# Patient Record
Sex: Female | Born: 1940 | Race: Black or African American | Hispanic: No | Smoking: Former smoker
Health system: Southern US, Community
[De-identification: ages and names within clinical notes are randomized; demographics above are authoritative.]

## PROBLEM LIST (undated history)

## (undated) DIAGNOSIS — E119 Type 2 diabetes mellitus without complications: Secondary | ICD-10-CM

## (undated) DIAGNOSIS — I739 Peripheral vascular disease, unspecified: Secondary | ICD-10-CM

## (undated) DIAGNOSIS — E785 Hyperlipidemia, unspecified: Secondary | ICD-10-CM

## (undated) DIAGNOSIS — R569 Unspecified convulsions: Secondary | ICD-10-CM

## (undated) DIAGNOSIS — F32A Depression, unspecified: Secondary | ICD-10-CM

## (undated) DIAGNOSIS — F329 Major depressive disorder, single episode, unspecified: Secondary | ICD-10-CM

## (undated) DIAGNOSIS — I10 Essential (primary) hypertension: Secondary | ICD-10-CM

## (undated) HISTORY — DX: Essential (primary) hypertension: I10

## (undated) HISTORY — PX: ABDOMINAL HYSTERECTOMY: SHX81

## (undated) HISTORY — DX: Unspecified convulsions: R56.9

## (undated) HISTORY — DX: Type 2 diabetes mellitus without complications: E11.9

## (undated) HISTORY — PX: EYE SURGERY: SHX253

## (undated) HISTORY — PX: SHOULDER SURGERY: SHX246

## (undated) HISTORY — DX: Depression, unspecified: F32.A

## (undated) HISTORY — DX: Major depressive disorder, single episode, unspecified: F32.9

---

## 2006-08-21 ENCOUNTER — Other Ambulatory Visit: Payer: Self-pay

## 2006-08-21 ENCOUNTER — Emergency Department: Payer: Self-pay | Admitting: Unknown Physician Specialty

## 2006-12-09 ENCOUNTER — Emergency Department: Payer: Self-pay | Admitting: Emergency Medicine

## 2007-06-29 ENCOUNTER — Emergency Department: Payer: Self-pay | Admitting: Emergency Medicine

## 2009-07-23 ENCOUNTER — Ambulatory Visit: Payer: Self-pay | Admitting: Podiatry

## 2009-08-17 ENCOUNTER — Ambulatory Visit: Payer: Self-pay | Admitting: Family Medicine

## 2009-09-29 ENCOUNTER — Ambulatory Visit: Payer: Self-pay | Admitting: Gastroenterology

## 2012-03-05 ENCOUNTER — Ambulatory Visit: Payer: Self-pay | Admitting: Orthopedic Surgery

## 2013-08-02 ENCOUNTER — Ambulatory Visit: Payer: Self-pay | Admitting: Family Medicine

## 2013-08-02 LAB — CREATININE, SERUM
Creatinine: 0.84 mg/dL (ref 0.60–1.30)
EGFR (Non-African Amer.): >60

## 2014-07-14 ENCOUNTER — Ambulatory Visit: Payer: Self-pay | Admitting: Family Medicine

## 2014-07-17 ENCOUNTER — Ambulatory Visit: Payer: Self-pay | Admitting: Family Medicine

## 2014-07-18 ENCOUNTER — Emergency Department: Payer: Self-pay | Admitting: Emergency Medicine

## 2014-07-18 LAB — CBC WITH DIFFERENTIAL/PLATELET
Basophil #: 0 10*3/uL (ref 0.0–0.1)
Basophil %: 0.5 %
Eosinophil #: 0 10*3/uL (ref 0.0–0.7)
Eosinophil %: 0.3 %
HCT: 39.3 % (ref 35.0–47.0)
HGB: 12.9 g/dL (ref 12.0–16.0)
Lymphocyte #: 1 10*3/uL (ref 1.0–3.6)
Lymphocyte %: 12.5 %
MCH: 28.8 pg (ref 26.0–34.0)
MCHC: 32.8 g/dL (ref 32.0–36.0)
MCV: 88 fL (ref 80–100)
Monocyte #: 0.3 x10 3/mm (ref 0.2–0.9)
Monocyte %: 4 %
Neutrophil #: 6.5 10*3/uL (ref 1.4–6.5)
Neutrophil %: 82.7 %
Platelet: 211 10*3/uL (ref 150–440)
RBC: 4.47 10*6/uL (ref 3.80–5.20)
RDW: 15.5 % — ABNORMAL HIGH (ref 11.5–14.5)
WBC: 7.9 10*3/uL (ref 3.6–11.0)

## 2014-07-18 LAB — URINALYSIS, COMPLETE
BACTERIA: NONE SEEN
Bilirubin,UR: NEGATIVE
Blood: NEGATIVE
Glucose,UR: >=500 mg/dL (ref 0–75)
Hyaline Cast: 2
Ketone: NEGATIVE
NITRITE: NEGATIVE
PH: 5 (ref 4.5–8.0)
PROTEIN: NEGATIVE
RBC,UR: 1 /HPF (ref 0–5)
Specific Gravity: 1.024 (ref 1.003–1.030)
WBC UR: 1 /HPF (ref 0–5)

## 2014-07-18 LAB — COMPREHENSIVE METABOLIC PANEL
Albumin: 3.6 g/dL (ref 3.4–5.0)
Alkaline Phosphatase: 118 U/L — ABNORMAL HIGH
Anion Gap: 7 (ref 7–16)
BUN: 20 mg/dL — ABNORMAL HIGH (ref 7–18)
Bilirubin,Total: 0.2 mg/dL (ref 0.2–1.0)
Calcium, Total: 8.9 mg/dL (ref 8.5–10.1)
Chloride: 101 mmol/L (ref 98–107)
Co2: 23 mmol/L (ref 21–32)
Creatinine: 1.08 mg/dL (ref 0.60–1.30)
EGFR (African American): 59 — ABNORMAL LOW
EGFR (Non-African Amer.): 51 — ABNORMAL LOW
Glucose: 262 mg/dL — ABNORMAL HIGH (ref 65–99)
Osmolality: 274 (ref 275–301)
Potassium: 4.2 mmol/L (ref 3.5–5.1)
SGOT(AST): 33 U/L (ref 15–37)
SGPT (ALT): 46 U/L
Sodium: 131 mmol/L — ABNORMAL LOW (ref 136–145)
Total Protein: 7.8 g/dL (ref 6.4–8.2)

## 2014-07-18 LAB — TROPONIN I: Troponin-I: <0.02 ng/mL

## 2014-07-18 LAB — LIPASE, BLOOD: Lipase: 159 U/L (ref 73–393)

## 2014-07-20 LAB — PATHOLOGY REPORT

## 2014-11-16 ENCOUNTER — Ambulatory Visit: Payer: Self-pay | Admitting: Ophthalmology

## 2015-03-03 ENCOUNTER — Other Ambulatory Visit: Payer: Self-pay | Admitting: *Deleted

## 2015-03-03 ENCOUNTER — Encounter: Payer: Self-pay | Admitting: *Deleted

## 2015-03-03 VITALS — BP 145/72 | HR 91 | Resp 20 | Ht 64.0 in | Wt 207.0 lb

## 2015-03-03 DIAGNOSIS — IMO0001 Reserved for inherently not codable concepts without codable children: Secondary | ICD-10-CM | POA: Insufficient documentation

## 2015-03-03 DIAGNOSIS — R030 Elevated blood-pressure reading, without diagnosis of hypertension: Principal | ICD-10-CM

## 2015-03-03 NOTE — Patient Outreach (Signed)
   03/03/2015   Megan Gates Mar 08, 1941 [id number]    Subjective:  Pt reports her son from [place] came down, took her out.  Pt states she is thinking of going to see him.  Pt states she is to see Dr. Thana AtesMorrisey in June for a physical.  Pt states she got a BP machine in the mail, does not know how to use it.  Pt states blood Sugar yesterday was 120, 2 days earlier 133.     Plan:   With RN CM assisting pt on use of her new BP machine, pt to check BP/record- three times for the next 30 days.              RN CM to continue to provide ongoing community nurse case management services.                Next scheduled home visit 4/21.

## 2015-03-04 NOTE — Patient Instructions (Signed)
With pt receiving BP machine in the mail, demonstrated use to pt, voiced understanding.   Pt instructed to check BP three times a week, [facility]- also informed son. Pt to continue to monitor sodium intake.

## 2015-03-04 NOTE — Patient Outreach (Signed)
   03/04/2015   Lovey NewcomerBetty Lou Jamison 1941-08-30 [id number]  Subjective: Pt reports son from [place] came down for 3 days, took her out.  Pt states I am thinking of to going to see son.  Pt states blood sugar yesterday was 120, 133 on Sunday.  Pt states she is to f/u with Dr. Thana AtesMorrisey in June for physical.  Pt states she got this BP machine in the mail, reports does not know how to use.      Objective:  Health assessment done.  Niece still filling pt's pill planner weekly, son makes sure pt takes meds.   Assessment:  Review of pt's recorded blood sugars in [facility]- 105,120,120.                           Glucometer does not provide averages, only last reading.    Plan:  Next home visit- review care plan,work toward goals            Pt to start checking BP three days a week, [facility]            RN CM to [facility] for BP readings            Next home visit scheduled for 4/21.

## 2015-04-01 ENCOUNTER — Other Ambulatory Visit: Payer: Self-pay | Admitting: *Deleted

## 2015-04-01 VITALS — BP 160/80 | HR 79 | Resp 20

## 2015-04-01 DIAGNOSIS — I10 Essential (primary) hypertension: Secondary | ICD-10-CM

## 2015-04-02 ENCOUNTER — Other Ambulatory Visit: Payer: Self-pay | Admitting: *Deleted

## 2015-04-02 NOTE — Patient Outreach (Signed)
Attempt made to contact pt's son Mindi JunkerKenneth Summers (on [facility] consent form).   HIPPA compliant voice message left.     Shayne Alkenose M.   Pierzchala RN CCM [facility] Care [facility]  [phone]

## 2015-04-02 NOTE — Patient Outreach (Signed)
Received return phone call from son Mindi JunkerKenneth Summers (lives in [place], on [facility]).  RN CM discussed with son was informed by pt yesterday during home visit niece is not filling her pill planner.   For  Better med compliance for pt, discussed with son the  benefit of  Switching pt's medications to  [facility] where bubble packs provided.   Son states he is trying to get it done, to have niece do it.  Son states he will call tomorrow.   RN CM discussed with son was planning  [facility] but plan to do final home visit next month to review medication compliance.   Next home visit scheduled with pt- 5/20.   Shayne Alkenose M.   Pierzchala RN CCM [facility] Care Management  [phone]

## 2015-04-02 NOTE — Patient Outreach (Signed)
[facility]) Care Management  [facility] Care Manager  04/02/2015   Khiley Lieser [birth year] [id number]  Subjective: Pt states she fell recently going up back steps, no injury.  Pt states son who stays With her is not about to check her BP,needs niece who is a retired Engineer, civil (consulting).  Pt states her niece has Not been filling her pill planner, just taking medications  from the bottles, not missing any.  Pt states she Has 2 medications called in, need picking up.        Objective: ROS  Lungs clear.  No edema.  Filed Vitals:   04/01/15 1108  BP: 160/80  Pulse: 79  Resp: 20     Current Medications:  Current Outpatient Prescriptions  Medication Sig Dispense Refill  . atorvastatin (LIPITOR) 40 MG tablet Take 40 mg by mouth at bedtime.    . donepezil (ARICEPT) 10 MG tablet Take 10 mg by mouth every morning.    . fenofibrate (TRICOR) 145 MG tablet Take 145 mg by mouth daily. Pt states has no refills, pharmacy to call MD about refills.    Marland Kitchen ibuprofen (ADVIL,MOTRIN) 200 MG tablet Take 200 mg by mouth every 6 (six) hours as needed. Pt takes one every morning    . imipramine (TOFRANIL) 25 MG tablet Take 25 mg by mouth at bedtime. Pt takes three  25 mg tablets at bedtime    . meloxicam (MOBIC) 15 MG tablet Take 15 mg by mouth daily.    . memantine (NAMENDA XR) 28 MG CP24 24 hr capsule Take 28 mg by mouth daily.    Marland Kitchen omeprazole (PRILOSEC) 20 MG capsule Take 20 mg by mouth daily.    . Armodafinil 150 MG tablet Take 150 mg by mouth daily.     No current facility-administered medications for this visit.    Falls- pt reports recent fall, no injury   Assessment:  Elevated BP- son  not been checking pt's BP                          Medication compliance-  Review of meds- pt reports been out of Aricept for couple weeks, no                                                                     More refills on Fenofibrate.   Plan: RN CM spoke with Ward Chatters (pt's niece, on [facility] consent  form), niece states checks on             On pt daily, does not fill her pill planner,need to transfer pt's medications to [facility] (bubble             Packs), need to call son in [place].            RN CM called pt's pharmacy,requested refill on pt's Aricept and MD be called about Fenofibrate               (no more refills).            Plan to f/u with pt's son Mindi Junker (lives in [place]), discuss switching pt's medications to  [facility] (provide bubble packs) - improve compliance           Plan not to discharge pt today from RN CM services but f/u again next month.    Shayne Alkenose M.   Makailah Slavick RN CCM [facility] Care Management  [phone]

## 2015-04-09 ENCOUNTER — Other Ambulatory Visit: Payer: Self-pay | Admitting: *Deleted

## 2015-04-09 NOTE — Patient Outreach (Signed)
Follow up call:  Spoke with pt to see if son Iantha Fallen(Kenneth) from [place] f/u on switching medications to [facility] (provide bubble packs),also if son talked to niece (local) about this matter.   Pt states she has not talked to son, he has been trying.  Pt states she will be seeing niece this weekend and will talk to her.   RN CM discussed with pt the option of [facility].  Discussed with pt community resources available also Radio producer([employer], [facility]).  Plan to f/u again with pt about her medications, call 5/3.

## 2015-04-21 ENCOUNTER — Other Ambulatory Visit: Payer: Self-pay | Admitting: *Deleted

## 2015-04-21 NOTE — Patient Outreach (Signed)
Follow up phone call:  RN CM f/u with pt on her medications, if son or niece switched her to [facility] (provide bubble packs) to which pt states talked to niece, slow about doing things.   Pt states she has 2 bottles she called in for,did not pick up yet.  Pt states she took her last Meloxicam last night, did not call that one in yet.  Pt states she has been out of Imipramine 2 days, Fenofibrate out of for awhile, no refills on it.   Pt states she used to get her medications through the mail.  Pt states she is hoping to get her money tomorrow, pick up meds.   RN CM asked pt if can call her pharmacy and inquire about medications ready for pick up as well as status of Fenofibrate (this RN CM called pt's pharmacy last month- was to call MD about refill).   RN CM to call pt back.       Shayne Alkenose M.   Primus Gritton RN CCM [facility] Care Management  [phone]

## 2015-04-21 NOTE — Patient Outreach (Signed)
Follow up phone call:  RN CM informed pt [facility], was told Imipramine is ready for pick up, Meloxicam is too early to fill- filled 4/26, pt should still have some and have not heard back from MD on getting Fenofibrate refilled, will send him another fax.   Pt did confirm still has some Meloxicam.   RN CM discussed with pt doing an acute home visit tomorrow 5/12, talk [facility], assist pt as needed to which pt agreed.     Megan Alkenose M.   Pierzchala RN CCM [facility] Care Management  [phone]

## 2015-04-22 ENCOUNTER — Encounter: Payer: Self-pay | Admitting: *Deleted

## 2015-04-22 ENCOUNTER — Other Ambulatory Visit: Payer: Self-pay | Admitting: *Deleted

## 2015-04-22 NOTE — Patient Outreach (Signed)
[facility]) Care Management  [place]  04/22/2015   Megan Gates 02-20-1941 [id number]  Subjective:  Pt states did not sleep good last night, tries to exercise during the day- can't stand  Long, get tired.  Pt  states she has not picked up her one medicine (Imipramine) from the pharmacy yet, had enough  To take last night, hoping to get her money today.  Pt states she used to get her meds through the mail (indicated  Worked good).  Pt states she is to see Dr. Rutherford Nail 6/8.  Pt states blood sugar this am was 169.  Pt states  She has been watching her salt, had one [place] sausage/2 eggs/jelly this am.     Objective:  BP 170/88 left arm, 150/80 right arm. O2 sat -95, respirations-20, pulse 88.                      Blood sugars per view of pt's glucometer, ranges 120 to 213 (after eating), today 169.                Current Medications:  Current Outpatient Prescriptions  Medication Sig Dispense Refill  . atorvastatin (LIPITOR) 40 MG tablet Take 40 mg by mouth at bedtime.    . donepezil (ARICEPT) 10 MG tablet Take 10 mg by mouth every morning.    Marland Kitchen ibuprofen (ADVIL,MOTRIN) 200 MG tablet Take 200 mg by mouth every 6 (six) hours as needed. Pt takes one every morning    . imipramine (TOFRANIL) 25 MG tablet Take 25 mg by mouth at bedtime. Pt takes three  25 mg tablets at bedtime    . meloxicam (MOBIC) 15 MG tablet Take 15 mg by mouth daily.    . memantine (NAMENDA XR) 28 MG CP24 24 hr capsule Take 28 mg by mouth daily.    Marland Kitchen omeprazole (PRILOSEC) 20 MG capsule Take 20 mg by mouth daily.    . Armodafinil 150 MG tablet Take 150 mg by mouth daily.    . fenofibrate (TRICOR) 145 MG tablet Take 145 mg by mouth daily. Pt states has no refills, pharmacy to call MD about refills.     No current facility-administered medications for this visit.  note- pt states not taking Armodafinil   Assessment:  Medications- pt had all of her meds except Tricor, still waiting for MD to  call pharmacy to have  Refilled.   RN CM discussed with pt option of [facility] mail order pharmacy for her meds to which said agreed.   Elevated BP- 170/88 left arm, 150/80 right arm, closer monitoring of sodium intake needed.    Plan:  RN CM called Dr. Walker Kehr office, spoke with Larey Seat to relay message to nurse pt wants  To receive her medications through [facility] mail order pharmacy, MD needs to fax prescriptions, fax  Number provided.            Pt to monitor sodium intake, BP elevated today.            Pt to f/u with Dr. Rutherford Nail 6/8            RN CM to f/u with pt telephonically 5/19 to check on status of mail order pharmacy, if started                    Plan to resolve care plan/close pt's case.  [facility] CM Care Plan Problem One        Patient Outreach from 04/22/2015 in [place] Problem One   BP not within norm    Care Plan for Problem One  Active   [facility] CM Short Term Goal #1 (0-30 days)  pt to switch to low Na+ luncheon meat in the next 30 days    [facility] CM Short Term Goal #1 Start Date  02/04/15   [facility] CM Short Term Goal #1 Met Date  03/03/15   Interventions for Short Term Goal #1  Discussed with son switching luncheon meat to Low Na+ ([place]), showed current meat 661m per serving   [facility] CM Short Term Goal #2 (0-30 days)  Pt to check BP/record three times a week for the next 30 days    [facility] CM Short Term Goal #2 Start Date  03/03/15   [facility] Goal #2 Met Date  -- [not met ]   Interventions for Short Term Goal #2  Discussed with pt and son importance of monitoring BP/recording.     [facility] Care [facility] Problem Two        [place]  Medications issues- difficulty at times getting meds on time    Care Plan for Problem Two  Active   [facility] CM Short Term Goal #1 (0-30 days)  Pt would have a  better system of getting her meds within the next 10 days    [facility] CM  Short Term Goal #1 Start Date  04/22/15   Interventions for Short Term Goal #2   RN CM called pt's MD office, left message pt requesting [facility], MD needs to fax prescriptiions      RZara Chess   PWau[facility]aCare Management  [phone]

## 2015-04-29 ENCOUNTER — Other Ambulatory Visit: Payer: Self-pay | Admitting: *Deleted

## 2015-04-29 ENCOUNTER — Encounter: Payer: Self-pay | Admitting: *Deleted

## 2015-04-29 ENCOUNTER — Ambulatory Visit: Payer: Medicare PPO | Admitting: *Deleted

## 2015-04-29 NOTE — Patient Outreach (Signed)
F/u phone call to Dr. Clance BollMorrisey's office: Megan MondaySpoke with MD's nurse Megan JacobsonHelen, f/u on message RN CM  left 5/12 with pt's  request for MD to fax [facility].   MD's nurse  reviewed with RN CM pt's med list which included Armodafinil to which informed nurse  pt does not have, has not been taking.  MD's nurse confirmed  pt is to be on Armodafinil, as requested to [facility] pt's  [facility].      Plan to call pt, let her know MD's nurse to contact [employer], have meds filled through mail order   Megan Gates M.   Megan Brouillet RN CCM [facility] Care Management  [phone]

## 2015-04-29 NOTE — Patient Outreach (Signed)
Called pt back, informed her spoke with MD's nurse Megan JacobsonHelen (f/u on request pt wants to get all of her [facility]), MD's [facility] to [facility].   Discussed with pt plan to call her again 5/23 to see if [facility] her.  Plan to discharge if issue (medications switched to mail order) resolved.       Shayne Alkenose M.   Pierzchala RN CCM [facility] Care Management  [phone]

## 2015-04-29 NOTE — Patient Outreach (Signed)
Follow up phone call:  RN CM inquired of pt if [facility] her medications to which she said no.  Pt states she had to call her pharmacy today on 3 of her meds, had one pill left with each, to try and get medications today.   RN CM discussed with pt plan to  f/u with Dr. Clance BollMorrisey's office, inquire if her medications were [facility] and get back to her.       Shayne Alkenose M.   Pierzchala RN CCM [facility] Care Management  [phone]

## 2015-04-29 NOTE — Patient Outreach (Signed)
[facility]) Care Management  04/29/2015  Lovey NewcomerBetty Lou Komatsu 03/12/1941 [id number]   Documentation encounter:  Attempt made to contact Dr. Clance BollMorrisey's office, inquire for pt if medications were [facility].   Message received said  office closed, will try again later.       Shayne Alkenose M.   Pierzchala RN CCM [facility] Care Management  [phone]

## 2015-04-30 ENCOUNTER — Ambulatory Visit: Payer: Medicare PPO | Admitting: *Deleted

## 2015-05-03 ENCOUNTER — Other Ambulatory Visit: Payer: Self-pay | Admitting: *Deleted

## 2015-05-03 NOTE — Patient Outreach (Signed)
Follow up phone call:  Pt states she [facility]  Pt states she picked up three of her meds from pharmacy (local), has all of her medications.   RN CM discussed f/u again 5/27 to see if [facility] her to which pt agreed.

## 2015-05-07 ENCOUNTER — Other Ambulatory Visit: Payer: Self-pay | Admitting: *Deleted

## 2015-05-07 ENCOUNTER — Encounter: Payer: Self-pay | Admitting: *Deleted

## 2015-05-07 NOTE — Patient Outreach (Signed)
Follow up phone call:  RN CM inquired of pt if heard from [employer] about getting her medication through mail order.  Pt states she did get medications in the mail, have enough to last.  Pt states she has a paper to send in when her medications are low.   Pt reports this is a better system (getting her meds through mail order).  Pt reports  she is compliant with taking all of her medications.  Discussed with pt plan to discharge her from RN CM services, goals met.   Plan to inform  [facility] CM assistant and MD of case closure.      M.    RN CCM [facility] Care Management  [phone]  

## 2015-05-13 NOTE — Patient Outreach (Signed)
[facility]) Care Management  05/13/2015  Megan Gates 01-Oct-1941 [id number]   Notification from [name], RN to close case as [facility] Care Management goals are met.  [name]. [place], [place] Management [place] Assistant Phone: [phone] Fax: [phone]

## 2015-05-19 ENCOUNTER — Encounter: Payer: Self-pay | Admitting: Family Medicine

## 2015-05-19 ENCOUNTER — Ambulatory Visit (INDEPENDENT_AMBULATORY_CARE_PROVIDER_SITE_OTHER): Payer: Medicare PPO | Admitting: Family Medicine

## 2015-05-19 VITALS — BP 128/76 | HR 84 | Temp 98.7°F | Resp 16 | Ht 64.0 in | Wt 205.3 lb

## 2015-05-19 DIAGNOSIS — G47419 Narcolepsy without cataplexy: Secondary | ICD-10-CM

## 2015-05-19 DIAGNOSIS — F0390 Unspecified dementia without behavioral disturbance: Secondary | ICD-10-CM

## 2015-05-19 DIAGNOSIS — I10 Essential (primary) hypertension: Secondary | ICD-10-CM | POA: Diagnosis not present

## 2015-05-19 DIAGNOSIS — E669 Obesity, unspecified: Secondary | ICD-10-CM

## 2015-05-19 DIAGNOSIS — E785 Hyperlipidemia, unspecified: Secondary | ICD-10-CM | POA: Diagnosis not present

## 2015-05-19 DIAGNOSIS — E119 Type 2 diabetes mellitus without complications: Secondary | ICD-10-CM

## 2015-05-19 LAB — GLUCOSE, POCT (MANUAL RESULT ENTRY): POC Glucose: 174 mg/dl — AB (ref 70–99)

## 2015-05-19 LAB — POCT GLYCOSYLATED HEMOGLOBIN (HGB A1C): HEMOGLOBIN A1C: 7.2

## 2015-05-19 NOTE — Progress Notes (Signed)
Name: Megan Gates   MRN: [id number]    DOB: Aug 02, 1941   Date:05/19/2015       Progress Note  Subjective  Chief Complaint  Chief Complaint  Patient presents with  . Depression  . Hypertension  . Diabetes  . Dementia    Hypertension This is a chronic problem. The current episode started more than 1 year ago. The problem has been gradually improving since onset. The problem is controlled. Associated symptoms include anxiety and headaches. Pertinent negatives include no blurred vision, chest pain, neck pain, orthopnea, palpitations or shortness of breath. There are no associated agents to hypertension. Risk factors for coronary artery disease include dyslipidemia, diabetes mellitus, sedentary lifestyle and smoking/tobacco exposure. The current treatment provides moderate improvement. Compliance problems include diet.  There is no history of CVA.  Diabetes She presents for her follow-up diabetic visit. She has type 2 diabetes mellitus. Hypoglycemia symptoms include headaches. Pertinent negatives for hypoglycemia include no dizziness, nervousness/anxiousness, seizures or tremors. Pertinent negatives for diabetes include no blurred vision, no chest pain, no weakness and no weight loss. Pertinent negatives for diabetic complications include no autonomic neuropathy or CVA. Risk factors for coronary artery disease include diabetes mellitus, dyslipidemia and hypertension. Her weight is increasing steadily. She is following a diabetic diet. She has had a previous visit with a dietitian. She rarely participates in exercise.   NARCOLEPSY Fewer spells have been noted now that she is on her medication on a regular basis.  DEMENTIA Granddaughter states that her memory has improved. Patient is well remarks that she seems to be less forgetful than in the past and there are no hallucinations currently.    DEPRESSION Patient's depression is overall improved. She has fewer crying episodes. She is more  socially involved. She remains her medications on a regular basis.   Past Medical History  Diagnosis Date  . Hypertension   . Diabetes mellitus without complication   . Seizures   . Depression     History  Substance Use Topics  . Smoking status: Former Smoker -- 1.00 packs/day  . Smokeless tobacco: Former Neurosurgeon  . Alcohol Use: No     Current outpatient prescriptions:  .  Armodafinil 150 MG tablet, Take 150 mg by mouth daily., Disp: , Rfl:  .  atorvastatin (LIPITOR) 40 MG tablet, Take 40 mg by mouth at bedtime., Disp: , Rfl:  .  donepezil (ARICEPT) 10 MG tablet, Take 10 mg by mouth every morning., Disp: , Rfl:  .  fenofibrate (TRICOR) 145 MG tablet, Take 145 mg by mouth daily. Pt states has no refills, pharmacy to call MD about refills., Disp: , Rfl:  .  ibuprofen (ADVIL,MOTRIN) 200 MG tablet, Take 200 mg by mouth every 6 (six) hours as needed. Pt takes one every morning, Disp: , Rfl:  .  imipramine (TOFRANIL) 25 MG tablet, Take 25 mg by mouth at bedtime. Pt takes three  25 mg tablets at bedtime, Disp: , Rfl:  .  meloxicam (MOBIC) 15 MG tablet, Take 15 mg by mouth daily., Disp: , Rfl:  .  memantine (NAMENDA XR) 28 MG CP24 24 hr capsule, Take 28 mg by mouth daily., Disp: , Rfl:  .  omeprazole (PRILOSEC) 20 MG capsule, Take 20 mg by mouth daily., Disp: , Rfl:   No Known Allergies  Review of Systems  Constitutional: Negative for fever, chills and weight loss.  HENT: Negative for congestion, hearing loss, sore throat and tinnitus.   Eyes: Negative for blurred vision, double vision  and redness.  Respiratory: Negative for cough, hemoptysis and shortness of breath.   Cardiovascular: Negative for chest pain, palpitations, orthopnea, claudication and leg swelling.  Gastrointestinal: Negative for heartburn, nausea, vomiting, diarrhea, constipation and blood in stool.  Genitourinary: Negative for dysuria, urgency, frequency and hematuria.  Musculoskeletal: Negative for myalgias, back pain,  joint pain, falls and neck pain.  Skin: Negative for itching.  Neurological: Positive for headaches. Negative for dizziness, tingling, tremors, focal weakness, seizures, loss of consciousness and weakness.  Endo/Heme/Allergies: Does not bruise/bleed easily.  Psychiatric/Behavioral: Negative for depression and substance abuse. The patient is not nervous/anxious and does not have insomnia.        Narcoleptic episodes less frequent     Objective  Filed Vitals:   05/19/15 1020  BP: 128/76  Pulse: 84  Temp: 98.7 F (37.1 C)  TempSrc: Oral  Resp: 16  Height: 5\' 4"  (1.626 m)  Weight: 205 lb 4.8 oz (93.123 kg)  SpO2: 97%     Physical Exam  Constitutional: She is oriented to person, place, and time and well-developed, well-nourished, and in no distress.  HENT:  Head: Normocephalic.  Eyes: EOM are normal. Pupils are equal, round, and reactive to light.  Neck: Normal range of motion. No thyromegaly present.  Cardiovascular: Normal rate, regular rhythm and normal heart sounds.   No murmur heard. Pulmonary/Chest: Effort normal and breath sounds normal.  Abdominal: Soft. Bowel sounds are normal.  Musculoskeletal: Normal range of motion. She exhibits no edema.  Neurological: She is alert and oriented to person, place, and time. No cranial nerve deficit. Gait normal.  Skin: Skin is warm and dry. No rash noted.  Psychiatric: Memory and affect normal.      Assessment & Plan  1. Type 2 diabetes mellitus without complication Well-controlled by diet alone - POCT Glucose (CBG) - POCT HgB A1C - Comprehensive metabolic panel; Future - Comprehensive metabolic panel  2. Essential hypertension Currently under good control  3. 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 Obesity A perennial problem and patient is not  motivated  6. Dementia, without behavioral  disturbance Improving overall blood palpation and granddaughter's report - TSH

## 2015-05-19 NOTE — Patient Instructions (Signed)
F/u

## 2015-05-20 LAB — TSH: TSH: 0.936 u[IU]/mL (ref 0.450–4.500)

## 2015-05-20 LAB — COMPREHENSIVE METABOLIC PANEL
ALBUMIN: 4.3 g/dL (ref 3.5–4.8)
ALK PHOS: 133 IU/L — AB (ref 39–117)
ALT: 11 IU/L (ref 0–32)
AST: 16 IU/L (ref 0–40)
Albumin/Globulin Ratio: 1.5 (ref 1.1–2.5)
BUN/Creatinine Ratio: 22 (ref 11–26)
BUN: 17 mg/dL (ref 8–27)
Bilirubin Total: <0.2 mg/dL (ref 0.0–1.2)
CHLORIDE: 101 mmol/L (ref 97–108)
CO2: 22 mmol/L (ref 18–29)
CREATININE: 0.76 mg/dL (ref 0.57–1.00)
Calcium: 9.4 mg/dL (ref 8.7–10.3)
GFR calc Af Amer: 89 mL/min/{1.73_m2} (ref >59)
GFR calc non Af Amer: 77 mL/min/{1.73_m2} (ref >59)
GLUCOSE: 196 mg/dL — AB (ref 65–99)
Globulin, Total: 2.8 g/dL (ref 1.5–4.5)
Potassium: 4.7 mmol/L (ref 3.5–5.2)
SODIUM: 140 mmol/L (ref 134–144)
TOTAL PROTEIN: 7.1 g/dL (ref 6.0–8.5)

## 2015-05-20 LAB — LIPID PANEL
CHOL/HDL RATIO: 2.9 ratio (ref 0.0–4.4)
CHOLESTEROL TOTAL: 126 mg/dL (ref 100–199)
HDL: 43 mg/dL (ref >39)
LDL Calculated: 34 mg/dL (ref 0–99)
Triglycerides: 246 mg/dL — ABNORMAL HIGH (ref 0–149)
VLDL Cholesterol Cal: 49 mg/dL — ABNORMAL HIGH (ref 5–40)

## 2015-05-20 NOTE — Progress Notes (Signed)
Patient notified

## 2015-09-09 ENCOUNTER — Ambulatory Visit: Payer: Medicare PPO | Admitting: Family Medicine

## 2015-09-21 ENCOUNTER — Encounter: Payer: Self-pay | Admitting: Family Medicine

## 2015-09-21 ENCOUNTER — Ambulatory Visit (INDEPENDENT_AMBULATORY_CARE_PROVIDER_SITE_OTHER): Payer: Medicare PPO | Admitting: Family Medicine

## 2015-09-21 VITALS — BP 138/60 | HR 95 | Temp 98.5°F | Resp 16 | Ht 64.0 in | Wt 200.5 lb

## 2015-09-21 DIAGNOSIS — I10 Essential (primary) hypertension: Secondary | ICD-10-CM | POA: Diagnosis not present

## 2015-09-21 DIAGNOSIS — G47419 Narcolepsy without cataplexy: Secondary | ICD-10-CM | POA: Diagnosis not present

## 2015-09-21 DIAGNOSIS — F0390 Unspecified dementia without behavioral disturbance: Secondary | ICD-10-CM | POA: Diagnosis not present

## 2015-09-21 DIAGNOSIS — N183 Chronic kidney disease, stage 3 unspecified: Secondary | ICD-10-CM

## 2015-09-21 DIAGNOSIS — E785 Hyperlipidemia, unspecified: Secondary | ICD-10-CM

## 2015-09-21 DIAGNOSIS — R413 Other amnesia: Secondary | ICD-10-CM | POA: Diagnosis not present

## 2015-09-21 DIAGNOSIS — E1169 Type 2 diabetes mellitus with other specified complication: Secondary | ICD-10-CM | POA: Diagnosis not present

## 2015-09-21 LAB — POCT GLYCOSYLATED HEMOGLOBIN (HGB A1C): Hemoglobin A1C: 6.5

## 2015-09-21 LAB — GLUCOSE, POCT (MANUAL RESULT ENTRY): POC Glucose: 91 mg/dl (ref 70–99)

## 2015-09-21 NOTE — Progress Notes (Signed)
Name: Megan Gates   MRN: [id number]    DOB: Apr 01, 1941   Date:09/21/2015       Progress Note  Subjective  Chief Complaint  Chief Complaint  Patient presents with  . Hypertension  . Diabetes    4 month follow up  . Hyperlipidemia  . Memory Loss    HPI Diabetes  Patient presents for follow-up of diabetes which is present for over 5 years. Is currently on a regimen of diet and exercise alone . Patient states poor compliance with their diet and exercise. There's been no hypoglycemic episodes and there is no polyuria polydipsia polyphagia. His average fasting glucoses been in the low around - with a high around - . There is no end organ disease.  Last diabetic eye exam was apparently over one year ago.   Last visit with dietitian was over one year ago. Last microalbumin was obtained 20 on February 2016 .   Hypertension   Patient presents for follow-up of hypertension. It has been present for over 5 years.  Patient states that there is compliance with medical regimen which consists of exercise only . There is no end organ disease. Cardiac risk factors include hypertension hyperlipidemia and diabetes and tobacco abuse.  Exercise regimen consist of minimal .  Diet consist of an appropriate choices .  Hyperlipidemia  Patient has a history of hyperlipidemia for over 5 years.  Current medical regimen consist of atorvastatin 40 mg daily at bedtime .  Compliance is questionable .  Diet and exercise are currently followed not very well .  Risk factors for cardiovascular disease include hyperlipidemia see above .   There have been no side effects from the medication.    Dementia history of present illness  Daughter feels that the dementia has stabilized. Mom memory still has occasionally rub.Marland Kitchen Anxiety symptomatology has resolved. 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 No insomnia or other side effects  from the Provigil      Past Medical History  Diagnosis Date  . Hypertension   . Diabetes mellitus without complication (HCC)   . Seizures (HCC)   . Depression     Social History  Substance Use Topics  . Smoking status: Former Smoker -- 1.00 packs/day  . Smokeless tobacco: Former Neurosurgeon  . Alcohol Use: No     Current outpatient prescriptions:  .  Armodafinil 150 MG tablet, Take 150 mg by mouth daily., Disp: , Rfl:  .  atorvastatin (LIPITOR) 40 MG tablet, Take 40 mg by mouth at bedtime., Disp: , Rfl:  .  citalopram (CELEXA) 20 MG tablet, , Disp: , Rfl:  .  donepezil (ARICEPT) 10 MG tablet, Take 10 mg by mouth every morning., Disp: , Rfl:  .  fenofibrate (TRICOR) 145 MG tablet, Take 145 mg by mouth daily. Pt states has no refills, pharmacy to call MD about refills., Disp: , Rfl:  .  ibuprofen (ADVIL,MOTRIN) 200 MG tablet, Take 200 mg by mouth every 6 (six) hours as needed. Pt takes one every morning, Disp: , Rfl:  .  imipramine (TOFRANIL) 25 MG tablet, Take 25 mg by mouth at bedtime. Pt takes three  25 mg tablets at bedtime, Disp: , Rfl:  .  meloxicam (MOBIC) 15 MG tablet, Take 15 mg by mouth daily., Disp: , Rfl:  .  memantine (NAMENDA XR) 28 MG CP24 24 hr capsule, Take 28 mg by mouth daily., Disp: ,  Rfl:  .  omeprazole (PRILOSEC) 20 MG capsule, Take 20 mg by mouth daily., Disp: , Rfl:   No Known Allergies  Review of Systems  Constitutional: Negative for fever, chills and weight loss.  HENT: Negative for congestion, hearing loss, sore throat and tinnitus.   Eyes: Negative for blurred vision, double vision and redness.  Respiratory: Positive for cough and wheezing. Negative for hemoptysis and shortness of breath.   Cardiovascular: Negative for chest pain, palpitations, orthopnea, claudication and leg swelling.  Gastrointestinal: Negative for heartburn, nausea, vomiting, diarrhea, constipation and blood in stool.  Genitourinary: Negative for dysuria, urgency, frequency and  hematuria.  Musculoskeletal: Positive for joint pain. Negative for myalgias, back pain, falls and neck pain.  Skin: Negative for itching.  Neurological: Negative for dizziness, tingling, tremors, focal weakness, seizures, loss of consciousness, weakness and headaches.       Narcolepsy  Endo/Heme/Allergies: Does not bruise/bleed easily.  Psychiatric/Behavioral: Positive for depression and memory loss. Negative for substance abuse. The patient is not nervous/anxious and does not have insomnia.      Objective  Filed Vitals:   09/21/15 1028  BP: 138/60  Pulse: 95  Temp: 98.5 F (36.9 C)  TempSrc: Oral  Resp: 16  Height:  (1.626 m)  Weight: 200 lb 8 oz (90.946 kg)  SpO2: 96%     Physical Exam  Constitutional:  Obese African-American in no acute distress  HENT:  Head: Normocephalic.  Eyes: EOM are normal. Pupils are equal, round, and reactive to light.  Neck: Normal range of motion. No thyromegaly present.  Cardiovascular: Normal rate, regular rhythm and normal heart sounds.   No murmur heard. Pulmonary/Chest: Effort normal and breath sounds normal.  Abdominal: Soft. Bowel sounds are normal.  Musculoskeletal: She exhibits edema.  Neurological: She is alert. No cranial nerve deficit.  Patient with dementia and forgetfulness but improved since last visit  Skin: Skin is warm and dry. No rash noted.         Assessment & Plan   1. Type 2 diabetes mellitus with other specified complication (HCC) Now back in normal range - POCT HgB A1C - POCT Glucose (CBG) - Ambulatory Referral for Home Visit  2. Essential hypertension Near goal - POCT HgB A1C  3. Hyperlipidemia  4. Memory change Slightly improved and I'll outline - Ambulatory Referral for Home Visit  5. Narcolepsy Stable  6. Dementia, without behavioral disturbance Stable fine work Some

## 2015-09-24 ENCOUNTER — Ambulatory Visit (INDEPENDENT_AMBULATORY_CARE_PROVIDER_SITE_OTHER): Payer: Medicare PPO

## 2015-09-24 DIAGNOSIS — Z23 Encounter for immunization: Secondary | ICD-10-CM | POA: Diagnosis not present

## 2015-09-27 ENCOUNTER — Telehealth: Payer: Self-pay | Admitting: Surgery

## 2015-09-27 NOTE — Telephone Encounter (Signed)
Dr Ninetta LightsHatcher said we don't treat pts w/ this diagnosis-Told Shelley in PCP's office (Type 2 Diabetes mellitus

## 2015-10-01 ENCOUNTER — Other Ambulatory Visit: Payer: Self-pay | Admitting: Family Medicine

## 2015-11-01 ENCOUNTER — Encounter: Payer: Self-pay | Admitting: Family Medicine

## 2015-11-01 ENCOUNTER — Ambulatory Visit
Admission: RE | Admit: 2015-11-01 | Discharge: 2015-11-01 | Disposition: A | Discharge status: Home / Self Care | Payer: Medicare PPO | Source: Ambulatory Visit | Attending: Family Medicine | Admitting: Family Medicine

## 2015-11-01 ENCOUNTER — Ambulatory Visit (INDEPENDENT_AMBULATORY_CARE_PROVIDER_SITE_OTHER): Payer: Medicare PPO | Admitting: Family Medicine

## 2015-11-01 VITALS — BP 128/70 | HR 82 | Temp 98.7°F | Resp 16 | Ht 64.0 in | Wt 193.0 lb

## 2015-11-01 DIAGNOSIS — I96 Gangrene, not elsewhere classified: Secondary | ICD-10-CM

## 2015-11-01 DIAGNOSIS — M19041 Primary osteoarthritis, right hand: Secondary | ICD-10-CM | POA: Insufficient documentation

## 2015-11-01 DIAGNOSIS — M79644 Pain in right finger(s): Secondary | ICD-10-CM | POA: Diagnosis not present

## 2015-11-01 DIAGNOSIS — L03818 Cellulitis of other sites: Secondary | ICD-10-CM | POA: Diagnosis not present

## 2015-11-01 DIAGNOSIS — L98499 Non-pressure chronic ulcer of skin of other sites with unspecified severity: Secondary | ICD-10-CM | POA: Diagnosis not present

## 2015-11-01 MED ORDER — CLINDAMYCIN HCL 300 MG PO CAPS: 300.0000 mg | ORAL_CAPSULE | Freq: Three times a day (TID) | ORAL | Status: DC

## 2015-11-01 NOTE — Progress Notes (Signed)
Name: Megan Gates   MRN: [id number]    DOB: 1941-05-14   Date:11/01/2015       Progress Note  Subjective  Chief Complaint  Chief Complaint  Patient presents with  . Finger Injury    pointer finger on right hand for 2 weeks, painful, black    HPI  Finger injury  Patient has suffered an injury to the right index finger about 2 weeks ago. She has been doing her own self remedies at home. She is having increasing pain and tenderness. This been no fever or chills. Patient is a diabetic. Ears exquisitely tender in the pain is increasing in severity. This been no fever or chills  Past Medical History  Diagnosis Date  . Hypertension   . Diabetes mellitus without complication (HCC)   . Seizures (HCC)   . Depression     Social History  Substance Use Topics  . Smoking status: Former Smoker -- 1.00 packs/day  . Smokeless tobacco: Former NeurosurgeonUser  . Alcohol Use: No     Current outpatient prescriptions:  .  Armodafinil 150 MG tablet, Take 150 mg by mouth daily., Disp: , Rfl:  .  atorvastatin (LIPITOR) 40 MG tablet, TAKE 1 TABLET AT BEDTIME, Disp: 90 tablet, Rfl: 1 .  citalopram (CELEXA) 20 MG tablet, TAKE 1 TABLET EVERY DAY, Disp: 90 tablet, Rfl: 1 .  donepezil (ARICEPT) 10 MG tablet, TAKE 1 TABLET AT BEDTIME, Disp: 90 tablet, Rfl: 1 .  fenofibrate (TRICOR) 145 MG tablet, TAKE 1 TABLET EVERY DAY, Disp: 90 tablet, Rfl: 1 .  ibuprofen (ADVIL,MOTRIN) 200 MG tablet, Take 200 mg by mouth every 6 (six) hours as needed. Pt takes one every morning, Disp: , Rfl:  .  imipramine (TOFRANIL) 25 MG tablet, TAKE 3 TABLETS AT BEDTIME, Disp: 270 tablet, Rfl: 1 .  meloxicam (MOBIC) 15 MG tablet, TAKE 1 TABLET EVERY DAY, Disp: 90 tablet, Rfl: 1 .  NAMENDA XR 28 MG CP24 24 hr capsule, TAKE 1 CAPSULE EVERY DAY, Disp: 90 capsule, Rfl: 1 .  omeprazole (PRILOSEC) 20 MG capsule, TAKE 1 CAPSULE EVERY DAY, Disp: 90 capsule, Rfl: 1  No Known Allergies  Review of Systems  Constitutional: Negative for fever  and chills.  Musculoskeletal: Positive for joint pain.  Skin:       Cellulitis of the right index finger       Objective  Filed Vitals:   11/01/15 1041  BP: 128/70  Pulse: 82  Temp: 98.7 F (37.1 C)  TempSrc: Oral  Resp: 16  Height: 5\' 4"  (1.626 m)  Weight: 193 lb (87.544 kg)  SpO2: 97%     Physical Exam  Constitutional:  Obese in no acute distress  Skin:  There is an area of cellulitis with paronychia and distal eschar bordering on necrosis of the right index finger. Ears exquisitely tender.      Assessment & Plan   1. Cellulitis of other specified site - Wound culture - Ambulatory referral to Hand Surgery - DG Finger Index Right; Future - Wound culture - clindamycin (CLEOCIN) 300 MG capsule; Take 1 capsule (300 mg total) by mouth 3 (three) times daily.  Dispense: 30 capsule; Refill: 0  2. 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

## 2015-11-03 LAB — WOUND CULTURE

## 2015-11-16 ENCOUNTER — Telehealth: Payer: Self-pay | Admitting: Emergency Medicine

## 2015-11-16 ENCOUNTER — Encounter: Payer: Self-pay | Admitting: Emergency Medicine

## 2015-11-16 NOTE — Telephone Encounter (Signed)
[facility] and stated they have finished there asessment with patient and she will now be discharged.

## 2015-12-15 ENCOUNTER — Other Ambulatory Visit: Payer: Self-pay | Admitting: Vascular Surgery

## 2015-12-22 ENCOUNTER — Ambulatory Visit
Admission: RE | Admit: 2015-12-22 | Discharge: 2015-12-22 | Disposition: A | Discharge status: Home / Self Care | Payer: Medicare PPO | Source: Ambulatory Visit | Attending: Vascular Surgery | Admitting: Vascular Surgery

## 2015-12-22 ENCOUNTER — Encounter
Admission: RE | Disposition: A | Discharge status: Home / Self Care | Payer: Self-pay | Source: Ambulatory Visit | Attending: Vascular Surgery

## 2015-12-22 ENCOUNTER — Encounter: Payer: Self-pay | Admitting: *Deleted

## 2015-12-22 DIAGNOSIS — I10 Essential (primary) hypertension: Secondary | ICD-10-CM | POA: Insufficient documentation

## 2015-12-22 DIAGNOSIS — E785 Hyperlipidemia, unspecified: Secondary | ICD-10-CM | POA: Insufficient documentation

## 2015-12-22 DIAGNOSIS — F17200 Nicotine dependence, unspecified, uncomplicated: Secondary | ICD-10-CM | POA: Insufficient documentation

## 2015-12-22 DIAGNOSIS — E1152 Type 2 diabetes mellitus with diabetic peripheral angiopathy with gangrene: Secondary | ICD-10-CM | POA: Diagnosis not present

## 2015-12-22 DIAGNOSIS — L98499 Non-pressure chronic ulcer of skin of other sites with unspecified severity: Secondary | ICD-10-CM | POA: Insufficient documentation

## 2015-12-22 DIAGNOSIS — I998 Other disorder of circulatory system: Secondary | ICD-10-CM | POA: Insufficient documentation

## 2015-12-22 HISTORY — DX: Hyperlipidemia, unspecified: E78.5

## 2015-12-22 HISTORY — PX: PERIPHERAL VASCULAR CATHETERIZATION: SHX172C

## 2015-12-22 HISTORY — DX: Peripheral vascular disease, unspecified: I73.9

## 2015-12-22 LAB — GLUCOSE, RANDOM: Glucose, Bld: 88 mg/dL (ref 65–99)

## 2015-12-22 LAB — CREATININE, SERUM: CREATININE: 0.83 mg/dL (ref 0.44–1.00)

## 2015-12-22 LAB — GLUCOSE, CAPILLARY
GLUCOSE-CAPILLARY: 76 mg/dL (ref 65–99)
Glucose-Capillary: 67 mg/dL (ref 65–99)
Glucose-Capillary: 76 mg/dL (ref 65–99)

## 2015-12-22 LAB — BUN: BUN: 13 mg/dL (ref 6–20)

## 2015-12-22 SURGERY — UPPER EXTREMITY ANGIOGRAPHY
Anesthesia: Moderate Sedation | Laterality: Right

## 2015-12-22 MED ORDER — HYDRALAZINE HCL 20 MG/ML IJ SOLN: 5.0000 mg | INTRAMUSCULAR | Status: DC | PRN

## 2015-12-22 MED ORDER — CEFUROXIME SODIUM 1.5 G IJ SOLR
1.5000 g | INTRAMUSCULAR | Status: AC
  Administered 2015-12-22: 1.5 g via INTRAVENOUS

## 2015-12-22 MED ORDER — HEPARIN SODIUM (PORCINE) 1000 UNIT/ML IJ SOLN
INTRAMUSCULAR | Status: AC
  Filled 2015-12-22: qty 1

## 2015-12-22 MED ORDER — FENTANYL CITRATE (PF) 100 MCG/2ML IJ SOLN
INTRAMUSCULAR | Status: AC
  Filled 2015-12-22: qty 2

## 2015-12-22 MED ORDER — LIDOCAINE-EPINEPHRINE (PF) 1 %-1:200000 IJ SOLN
INTRAMUSCULAR | Status: DC | PRN
  Administered 2015-12-22: 10 mL via INTRADERMAL

## 2015-12-22 MED ORDER — HEPARIN SODIUM (PORCINE) 1000 UNIT/ML IJ SOLN
INTRAMUSCULAR | Status: DC | PRN
  Administered 2015-12-22: 3000 [IU] via INTRAVENOUS
  Administered 2015-12-22: 2000 [IU] via INTRAVENOUS

## 2015-12-22 MED ORDER — SODIUM CHLORIDE 0.9 % IV SOLN
INTRAVENOUS | Status: DC
  Administered 2015-12-22: 13:00:00 via INTRAVENOUS

## 2015-12-22 MED ORDER — DEXTROSE 50 % IV SOLN
INTRAVENOUS | Status: AC
  Filled 2015-12-22: qty 50

## 2015-12-22 MED ORDER — FAMOTIDINE 20 MG PO TABS
40.0000 mg | ORAL_TABLET | ORAL | Status: DC | PRN
Start: 2015-12-22 — End: 2015-12-22

## 2015-12-22 MED ORDER — DEXTROSE 50 % IV SOLN
12.5000 g | Freq: Once | INTRAVENOUS | Status: AC
  Administered 2015-12-22: 12.5 g via INTRAVENOUS

## 2015-12-22 MED ORDER — METOPROLOL TARTRATE 1 MG/ML IV SOLN: 2.0000 mg | INTRAVENOUS | Status: DC | PRN

## 2015-12-22 MED ORDER — DEXTROSE 5 % IV SOLN
INTRAVENOUS | Status: AC
  Filled 2015-12-22 (×6): qty 1.5

## 2015-12-22 MED ORDER — HEPARIN (PORCINE) IN NACL 2-0.9 UNIT/ML-% IJ SOLN
INTRAMUSCULAR | Status: AC
  Filled 2015-12-22: qty 1000

## 2015-12-22 MED ORDER — FENTANYL CITRATE (PF) 100 MCG/2ML IJ SOLN
INTRAMUSCULAR | Status: DC | PRN
  Administered 2015-12-22 (×4): 50 ug via INTRAVENOUS

## 2015-12-22 MED ORDER — ONDANSETRON HCL 4 MG/2ML IJ SOLN: 4.0000 mg | Freq: Four times a day (QID) | INTRAMUSCULAR | Status: DC | PRN

## 2015-12-22 MED ORDER — OXYCODONE-ACETAMINOPHEN 5-325 MG PO TABS: 1.0000 | ORAL_TABLET | ORAL | Status: DC | PRN

## 2015-12-22 MED ORDER — ACETAMINOPHEN 325 MG RE SUPP: 325.0000 mg | RECTAL | Status: DC | PRN

## 2015-12-22 MED ORDER — METHYLPREDNISOLONE SODIUM SUCC 125 MG IJ SOLR: 125.0000 mg | INTRAMUSCULAR | Status: DC | PRN

## 2015-12-22 MED ORDER — SODIUM CHLORIDE 0.9 % IV SOLN: 500.0000 mL | Freq: Once | INTRAVENOUS | Status: DC | PRN

## 2015-12-22 MED ORDER — HYDROMORPHONE HCL 1 MG/ML IJ SOLN: 0.5000 mg | INTRAMUSCULAR | Status: DC | PRN

## 2015-12-22 MED ORDER — PHENOL 1.4 % MT LIQD: 1.0000 | OROMUCOSAL | Status: DC | PRN

## 2015-12-22 MED ORDER — MIDAZOLAM HCL 5 MG/5ML IJ SOLN
INTRAMUSCULAR | Status: AC
  Filled 2015-12-22: qty 5

## 2015-12-22 MED ORDER — LABETALOL HCL 5 MG/ML IV SOLN: 10.0000 mg | INTRAVENOUS | Status: DC | PRN

## 2015-12-22 MED ORDER — GUAIFENESIN-DM 100-10 MG/5ML PO SYRP: 15.0000 mL | ORAL_SOLUTION | ORAL | Status: DC | PRN

## 2015-12-22 MED ORDER — ACETAMINOPHEN 325 MG PO TABS: 325.0000 mg | ORAL_TABLET | ORAL | Status: DC | PRN

## 2015-12-22 MED ORDER — HYDROMORPHONE HCL 1 MG/ML IJ SOLN: 1.0000 mg | Freq: Once | INTRAMUSCULAR | Status: DC

## 2015-12-22 MED ORDER — IOHEXOL 300 MG/ML  SOLN
INTRAMUSCULAR | Status: DC | PRN
  Administered 2015-12-22: 90 mL via INTRA_ARTERIAL

## 2015-12-22 MED ORDER — CLOPIDOGREL BISULFATE 75 MG PO TABS: 75.0000 mg | ORAL_TABLET | Freq: Every day | ORAL | Status: DC

## 2015-12-22 MED ORDER — MIDAZOLAM HCL 2 MG/2ML IJ SOLN
INTRAMUSCULAR | Status: DC | PRN
Start: 2015-12-22 — End: 2015-12-22
  Administered 2015-12-22: 1 mg via INTRAVENOUS
  Administered 2015-12-22: 2 mg via INTRAVENOUS

## 2015-12-22 MED ORDER — LIDOCAINE-EPINEPHRINE (PF) 1 %-1:200000 IJ SOLN
INTRAMUSCULAR | Status: AC
  Filled 2015-12-22: qty 30

## 2015-12-22 SURGICAL SUPPLY — 18 items
BALLN ULTRVRSE 2X150X150 (BALLOONS) ×1
BALLN ULTRVRSE 2X150X150 OTW (BALLOONS) ×2
BALLOON ULTRVRSE 2X150X150 OTW (BALLOONS) ×2 IMPLANT
CATH CXI SUPP ANG 4FR 135 (MICROCATHETER) ×2 IMPLANT
CATH CXI SUPP ANG 4FR 135CM (MICROCATHETER) ×3
CATH H1 100CM (CATHETERS) ×3 IMPLANT
CATH KMP 5.0X100 (CATHETERS) ×3 IMPLANT
DEVICE PRESTO INFLATION (MISCELLANEOUS) ×3 IMPLANT
DEVICE STARCLOSE SE CLOSURE (Vascular Products) ×3 IMPLANT
GLIDEWIRE ADV .035X260CM (WIRE) ×3 IMPLANT
GLIDEWIRE ANGLED SS 035X260CM (WIRE) ×3 IMPLANT
GUIDEWIRE PFTE-COATED .018X300 (WIRE) ×3 IMPLANT
PACK ANGIOGRAPHY (CUSTOM PROCEDURE TRAY) ×3 IMPLANT
SHEATH BRITE TIP 5FRX11 (SHEATH) ×3 IMPLANT
SHEATH SHUTTLE SELECT 6F (SHEATH) ×3 IMPLANT
VALVE CHECKFLO PERFORMER (SHEATH) ×3 IMPLANT
WIRE G V18X300CM (WIRE) ×3 IMPLANT
WIRE J 3MM .035X145CM (WIRE) ×3 IMPLANT

## 2015-12-22 NOTE — H&P (Signed)
  [facility] History & Physical Update  The patient was interviewed and re-examined.  The patient's previous History and Physical has been reviewed and is unchanged.  There is no change in the plan of care. We plan to proceed with the scheduled procedure.  DEW,JASON, MD  12/22/2015, 3:28 PM

## 2015-12-22 NOTE — Op Note (Signed)
OPERATIVE REPORT   PREOPERATIVE DIAGNOSIS: 1. Gangrene right second finger   POSTOPERATIVE DIAGNOSIS: 1. Gangrene right second finger  PROCEDURE PERFORMED: 1. Ultrasound guidance vascular access to right femoral artery. 2. Catheter placement to right radial artery and right ulnar arteries  from right femoral approach. 3. Thoracic aortogram and selective right upper extremity angiogram  including selective images of the radial and ulnar arteries. 4.  Percutaneous transluminal angioplasty of right radial artery into the palmar arch with 2 mm diameter angioplasty balloon 5. StarClose closure device right femoral artery.  SURGEON: Algernon Huxley, MD  ANESTHESIA: Local with moderate conscious sedation for 45 minutes using 3 mg of Versed and 150 mcg of Fentanyl  BLOOD LOSS: Minimal.  FLUOROSCOPY TIME: 10.4 minutes  CONTRAST: 90 cc  INDICATION FOR PROCEDURE: This is a 76 yo AAF who presented to our office with gangrene of the right second finger. To further evaluate this to determine what options would be possible to treat the ischemia, angiogram of the left upper extremity is indicated. Risks and benefits are discussed. Informed consent was obtained.  DESCRIPTION OF PROCEDURE: The patient was brought to the vascular suite. Groins were shaved and prepped and sterile surgical field was created.She was connected to continuous pulse oximetry and telemetry and monitored by an RN under my direct supervision throughout the entirety of the procedure. The right femoral head was localized with fluoroscopy and the right femoral artery was then visualized with ultrasound and found to be widely patent. It was then accessed under direct ultrasound guidance without difficulty with a Seldinger needle and a permanent image was recorded. A J-wire and 5-French sheath were then placed. Pigtail catheter was placed into the ascending aorta and a thoracic aortogram was then  performed in the LAO projection. This demonstrated normal origins to the great vessels without significant proximal stenoses and a normal configuration of the great vessels. The patient was given 3000 units of intravenous heparin and a headhunter catheter was used to selectively cannulate the innominate artery and then the right subclavian artery without difficulty.An image in the innominate artery was performed and there appeared to be a mild stenosis at the origin of the right subclavian artery but this appeared to be less than 50%. This was then sequentially advanced to the brachial artery and to the brachial bifurcation.  On initial imaging, the ulnar artery was patent to the mid forearm but then occluded and reconstituted in the hand through the palmar arch. The radial artery was small and occluded in the mid to distal forearm reconstituting in the hand just before the palmar arch. The interosseous artery was actually providing much of the flow to the hand initially. An additional 2000 units of intravenous heparin were given for systemic anticoagulation and a 6 French shuttle sheath was placed over a Terumo advantage wire into the brachial artery. I then used a Terumo advantage wire and a 135 cm CXI catheter to advance into the ulnar artery. I attempted to cross the ulnar artery with the advantage wire, 0.018 advantage wire, and a V 18 wire but was never able to gain intraluminal access to the ulnar artery or palmar arch in the hand. I then turned my attention to the radial artery. The catheter and wire were pulled back and the CXI catheter was used to selectively enter the radial artery and selective imaging was performed. I was then able to cross the occlusion with a 0.018 advantage wire and get all the way to the other side of  the hand through the palmar arch into the distal ulnar artery. I then elected to proceed with treatment. I advanced a 2 mm diameter by 15 cm length angioplasty balloon through  the radial artery occlusion all the way to the palmar arch in the hand. This was gently inflated to 6 atm for the distal radial artery all the way into the palmar arch of the hand. The inflation was held for 1 minute. The balloon was then deflated and pulled back into the more mid to distal radial artery and inflated to 10 atm for 1 minute. Completion angiogram was then performed which showed brisk flow through the radial artery into the palmar arch. In her hand, there was brisk digital filling now through the radial artery to the second finger which was the finger in question. The diagnostic catheter was removed. Oblique arteriogram was performed of the right femoral artery and StarClose closure device deployed in the usual fashion with excellent hemostatic result. The patient tolerated the procedure well and was taken to the recovery room in stable condition.   , 12/22/2015 5:30 PM

## 2015-12-22 NOTE — Progress Notes (Signed)
Dr. Gilda CreaseSchnier notified prior to entry to vascular lab that her blood sugar is 76.  No orders received

## 2015-12-22 NOTE — Discharge Instructions (Signed)

## 2015-12-23 ENCOUNTER — Encounter: Payer: Self-pay | Admitting: Vascular Surgery

## 2016-01-25 ENCOUNTER — Ambulatory Visit: Payer: Medicare PPO | Admitting: Family Medicine

## 2016-02-14 ENCOUNTER — Other Ambulatory Visit: Payer: Self-pay | Admitting: Family Medicine

## 2016-02-21 ENCOUNTER — Ambulatory Visit: Payer: Medicare PPO | Admitting: Family Medicine

## 2016-03-16 ENCOUNTER — Ambulatory Visit: Payer: Medicare PPO | Admitting: Family Medicine

## 2016-04-25 ENCOUNTER — Other Ambulatory Visit: Payer: Self-pay | Admitting: Vascular Surgery

## 2016-04-26 ENCOUNTER — Ambulatory Visit: Payer: Medicare PPO | Admitting: Family Medicine

## 2016-04-30 MED ORDER — CEFUROXIME SODIUM 1.5 G IJ SOLR: 1.5000 g | INTRAMUSCULAR | Status: DC

## 2016-05-01 ENCOUNTER — Ambulatory Visit
Admission: RE | Admit: 2016-05-01 | Discharge: 2016-05-01 | Disposition: A | Discharge status: Home / Self Care | Payer: Medicare PPO | Source: Ambulatory Visit | Attending: Vascular Surgery | Admitting: Vascular Surgery

## 2016-05-01 ENCOUNTER — Encounter
Admission: RE | Disposition: A | Discharge status: Home / Self Care | Payer: Self-pay | Source: Ambulatory Visit | Attending: Vascular Surgery

## 2016-05-01 DIAGNOSIS — I7025 Atherosclerosis of native arteries of other extremities with ulceration: Secondary | ICD-10-CM | POA: Diagnosis not present

## 2016-05-01 DIAGNOSIS — I10 Essential (primary) hypertension: Secondary | ICD-10-CM | POA: Diagnosis not present

## 2016-05-01 DIAGNOSIS — F17210 Nicotine dependence, cigarettes, uncomplicated: Secondary | ICD-10-CM | POA: Diagnosis not present

## 2016-05-01 DIAGNOSIS — E785 Hyperlipidemia, unspecified: Secondary | ICD-10-CM | POA: Diagnosis not present

## 2016-05-01 DIAGNOSIS — L98499 Non-pressure chronic ulcer of skin of other sites with unspecified severity: Secondary | ICD-10-CM | POA: Diagnosis not present

## 2016-05-01 DIAGNOSIS — E119 Type 2 diabetes mellitus without complications: Secondary | ICD-10-CM | POA: Insufficient documentation

## 2016-05-01 DIAGNOSIS — Z90710 Acquired absence of both cervix and uterus: Secondary | ICD-10-CM | POA: Diagnosis not present

## 2016-05-01 DIAGNOSIS — M2550 Pain in unspecified joint: Secondary | ICD-10-CM | POA: Insufficient documentation

## 2016-05-01 DIAGNOSIS — I998 Other disorder of circulatory system: Secondary | ICD-10-CM | POA: Diagnosis not present

## 2016-05-01 DIAGNOSIS — Z6833 Body mass index (BMI) 33.0-33.9, adult: Secondary | ICD-10-CM | POA: Diagnosis not present

## 2016-05-01 DIAGNOSIS — I739 Peripheral vascular disease, unspecified: Secondary | ICD-10-CM | POA: Diagnosis not present

## 2016-05-01 HISTORY — PX: PERIPHERAL VASCULAR CATHETERIZATION: SHX172C

## 2016-05-01 LAB — BASIC METABOLIC PANEL
Anion gap: 6 (ref 5–15)
BUN: 24 mg/dL — AB (ref 6–20)
CHLORIDE: 110 mmol/L (ref 101–111)
CO2: 25 mmol/L (ref 22–32)
CREATININE: 0.89 mg/dL (ref 0.44–1.00)
Calcium: 9.2 mg/dL (ref 8.9–10.3)
GFR calc Af Amer: >60 mL/min (ref >60)
GFR calc non Af Amer: >60 mL/min (ref >60)
Glucose, Bld: 95 mg/dL (ref 65–99)
POTASSIUM: 4.1 mmol/L (ref 3.5–5.1)
Sodium: 141 mmol/L (ref 135–145)

## 2016-05-01 SURGERY — UPPER EXTREMITY ANGIOGRAPHY
Anesthesia: Moderate Sedation | Laterality: Right

## 2016-05-01 MED ORDER — MIDAZOLAM HCL 5 MG/5ML IJ SOLN
INTRAMUSCULAR | Status: AC
  Filled 2016-05-01: qty 5

## 2016-05-01 MED ORDER — FENTANYL CITRATE (PF) 100 MCG/2ML IJ SOLN
INTRAMUSCULAR | Status: DC | PRN
  Administered 2016-05-01 (×4): 50 ug via INTRAVENOUS

## 2016-05-01 MED ORDER — HYDROMORPHONE HCL 1 MG/ML IJ SOLN: 1.0000 mg | Freq: Once | INTRAMUSCULAR | Status: DC

## 2016-05-01 MED ORDER — FAMOTIDINE 20 MG PO TABS: 40.0000 mg | ORAL_TABLET | ORAL | Status: DC | PRN

## 2016-05-01 MED ORDER — FENTANYL CITRATE (PF) 100 MCG/2ML IJ SOLN
INTRAMUSCULAR | Status: AC
  Filled 2016-05-01: qty 2

## 2016-05-01 MED ORDER — HEPARIN (PORCINE) IN NACL 2-0.9 UNIT/ML-% IJ SOLN
INTRAMUSCULAR | Status: AC
  Filled 2016-05-01: qty 1000

## 2016-05-01 MED ORDER — LIDOCAINE-EPINEPHRINE (PF) 1 %-1:200000 IJ SOLN
INTRAMUSCULAR | Status: AC
  Filled 2016-05-01: qty 30

## 2016-05-01 MED ORDER — HEPARIN SODIUM (PORCINE) 1000 UNIT/ML IJ SOLN
INTRAMUSCULAR | Status: AC
  Filled 2016-05-01: qty 1

## 2016-05-01 MED ORDER — SODIUM CHLORIDE 0.9 % IV SOLN
INTRAVENOUS | Status: DC
  Administered 2016-05-01: 14:00:00 via INTRAVENOUS

## 2016-05-01 MED ORDER — METHYLPREDNISOLONE SODIUM SUCC 125 MG IJ SOLR: 125.0000 mg | INTRAMUSCULAR | Status: DC | PRN

## 2016-05-01 MED ORDER — IOPAMIDOL (ISOVUE-300) INJECTION 61%
INTRAVENOUS | Status: DC | PRN
  Administered 2016-05-01: 100 mL via INTRA_ARTERIAL

## 2016-05-01 MED ORDER — MIDAZOLAM HCL 2 MG/2ML IJ SOLN
INTRAMUSCULAR | Status: DC | PRN
  Administered 2016-05-01 (×2): 1 mg via INTRAVENOUS
  Administered 2016-05-01: 2 mg via INTRAVENOUS
  Administered 2016-05-01: 1 mg via INTRAVENOUS

## 2016-05-01 MED ORDER — HEPARIN SODIUM (PORCINE) 1000 UNIT/ML IJ SOLN
INTRAMUSCULAR | Status: DC | PRN
  Administered 2016-05-01: 1500 [IU] via INTRAVENOUS
  Administered 2016-05-01: 3500 [IU] via INTRAVENOUS

## 2016-05-01 MED ORDER — ONDANSETRON HCL 4 MG/2ML IJ SOLN: 4.0000 mg | Freq: Four times a day (QID) | INTRAMUSCULAR | Status: DC | PRN

## 2016-05-01 SURGICAL SUPPLY — 25 items
BALLN ARMADA 3.0X120X150 (BALLOONS) ×2
BALLN ARMADA 3X120X150 (BALLOONS) ×2
BALLN ULTRVRSE 2X300X150 (BALLOONS) ×2
BALLN ULTRVRSE 2X300X150 OTW (BALLOONS) ×2
BALLOON ARMADA 3X120X150 (BALLOONS) ×2 IMPLANT
BALLOON ULTRVRSE 2X300X150 OTW (BALLOONS) ×2 IMPLANT
CANNULA 5F STIFF (CANNULA) ×4 IMPLANT
CATH 5F KA2 (CATHETERS) ×4 IMPLANT
CATH 5FR JB2 100CM (CATHETERS) ×4 IMPLANT
CATH ANGIO 5F 100CM .035 PIG (CATHETERS) ×4 IMPLANT
CATH CXI SUPP ANG 4FR 135 (MICROCATHETER) ×2 IMPLANT
CATH CXI SUPP ANG 4FR 135CM (MICROCATHETER) ×4
CATH H1 100CM (CATHETERS) ×4 IMPLANT
DEVICE PRESTO INFLATION (MISCELLANEOUS) ×4 IMPLANT
DEVICE STARCLOSE SE CLOSURE (Vascular Products) ×4 IMPLANT
DEVICE TORQUE (MISCELLANEOUS) ×4 IMPLANT
GLIDEWIRE ANGLED SS 035X260CM (WIRE) ×4 IMPLANT
GUIDEWIRE ANGLED .035X260CM (WIRE) ×4 IMPLANT
GUIDEWIRE PFTE-COATED .018X300 (WIRE) ×4 IMPLANT
PACK ANGIOGRAPHY (CUSTOM PROCEDURE TRAY) ×4 IMPLANT
SHEATH BRITE TIP 5FRX11 (SHEATH) ×4 IMPLANT
SHEATH SHUTTLE SELECT 6F (SHEATH) ×4 IMPLANT
VALVE CHECKFLO PERFORMER (SHEATH) ×4 IMPLANT
WIRE J 3MM .035X145CM (WIRE) ×4 IMPLANT
WIRE MAGIC TORQUE 260C (WIRE) ×4 IMPLANT

## 2016-05-01 NOTE — Discharge Instructions (Signed)

## 2016-05-01 NOTE — H&P (Signed)
  [facility] History & Physical Update  The patient was interviewed and re-examined.  The patient's previous History and Physical has been reviewed and is unchanged.  There is no change in the plan of care. We plan to proceed with the scheduled procedure.  Payten Hobin, MD  05/01/2016, 2:12 PM

## 2016-05-01 NOTE — Op Note (Signed)
OPERATIVE REPORT   PREOPERATIVE DIAGNOSIS: 1. PAD with ulceration right finger.  POSTOPERATIVE DIAGNOSIS: 1. Same  PROCEDURE PERFORMED: 1. Ultrasound guidance vascular access to right femoral artery. 2. Catheter placement to right radial artery and right ulnar arteries  from right femoral approach. 3. Thoracic aortogram and selective right upper extremity angiogram  including selective images of the radial and ulnar arteries. 4. Percutaneous transluminal angioplasty of right radial artery all the way to the palmar arch with 2 mm diameter angioplasty balloon distally and 3 mm diameter angioplasty balloon in the proximal and mid segments. 5. StarClose closure device right femoral artery.  SURGEON: Algernon Huxley, MD  ANESTHESIA: Local with moderate conscious sedation for proximal 60 minutes using 5 mg of Versed and 200 g of fentanyl.  BLOOD LOSS:50 cc  FLUOROSCOPY TIME: 17.4 minutes  CONTRAST: 100 cc  INDICATION FOR PROCEDURE: This is a 75 year old African-American female who presented to our office with ulceration on a finger on her right hand and arterial insufficiency. The patient's hand is numb and painful. To further evaluate this to determine what options would be possible to treat the arterial insufficiency, angiogram of the left upper extremity is indicated. Risks and benefits are discussed. Informed consent was obtained.  DESCRIPTION OF PROCEDURE: The patient was brought to the vascular suite. Groins were shaved and prepped and sterile surgical field was created.With an RN administering medications under my direct supervision during a face-to-face encounter with the patient throughout the entirety of the procedure, she received moderate conscious sedation with continuous monitoring of her vital signs, pulse oximetry, telemetry, and mental status. The right femoral head was localized with fluoroscopy and the right femoral artery was then visualized  with ultrasound and found to be widely patent. It was then accessed under direct ultrasound guidance without difficulty with a Seldinger needle and a permanent image was recorded. A J-wire and 5-French sheath were then placed. Pigtail catheter was placed into the ascending aorta and a thoracic aortogram was then performed in the LAO projection. This demonstrated normal origins to the great vessels with the exception of a bovine configuration without significant proximal stenoses and a normal configuration of the great vessels. The patient was given 3000 units of intravenous heparin and a H1 catheter was used to selectively cannulate the right subclavian Artery. Due to a very steep angulation and tortuosity, this was quite tedious and a floppy Glidewire was used to help advance.. This was then sequentially advanced to the brachial artery and to the brachial bifurcation.  I then advanced a catheter beyond the brachial bifurcation and into the radial and ulnar arteries.  The radial artery was entered first, but the catheter only went to the very origin of the radial artery, then exchanged for a 135 cm CXI catheter to evaluate more distally in the radial artery.The radial artery was occluded in its proximal segment with reconstitution in the palmar arch in the hand. An additional 2000 units of intravenous heparin were given and I exchanged for a 90 cm 6 French sheath. I then exchanged for a 0.018 advantage wire and proceeded with treatment of the radial artery. I was able to cross the occlusion and get into the palmar arch in the hand with the help of the CXI catheter. For the distal radial artery all the way to the palmaris arch I used a 2 mm diameter angioplasty balloon. This was inflated to 8 atm for 1 minute. For the remainder of the radial artery all the way up to its  origin and I used 3 inflations with a 3 mm diameter by 10 cm length angioplasty balloon. These inflations were between 8 and 12 atm  and for 1 minute. Completion angiogram showed about a 30-40% residual stenosis in the distal radial artery with some degree of dissection that did not appear flow limiting, and brisk flow was now seen into the hand. After this, the catheter was removed and exchanged for a CXI catheter and placed into the ulnar artery, this was evaluated. The ulnar artery was occluded and also reconstituted in the hand at the palmar arch. I was not able to cross the occlusion in the palm ulnar artery and gained intraluminal flow distally, and after several attempts it was clear this was going to be unsuccessful and I decided not to proceed any further.  The diagnostic catheter was removed. Oblique arteriogram was performed of the right femoral artery and StarClose closure device deployed in the usual fashion with excellent hemostatic result. The patient tolerated the procedure well and was taken to the recovery room in stable condition.   Megan Gates 05/01/2016 5:11 PM

## 2016-05-02 ENCOUNTER — Ambulatory Visit: Payer: Medicare PPO | Admitting: Family Medicine

## 2016-05-03 ENCOUNTER — Encounter: Payer: Self-pay | Admitting: Vascular Surgery

## 2016-05-04 ENCOUNTER — Encounter: Payer: Self-pay | Admitting: Family Medicine

## 2016-05-04 ENCOUNTER — Ambulatory Visit (INDEPENDENT_AMBULATORY_CARE_PROVIDER_SITE_OTHER): Payer: Medicare PPO | Admitting: Family Medicine

## 2016-05-04 VITALS — BP 160/72 | HR 88 | Temp 98.7°F | Resp 18 | Ht 64.0 in | Wt 195.4 lb

## 2016-05-04 DIAGNOSIS — I10 Essential (primary) hypertension: Secondary | ICD-10-CM | POA: Insufficient documentation

## 2016-05-04 DIAGNOSIS — E119 Type 2 diabetes mellitus without complications: Secondary | ICD-10-CM

## 2016-05-04 DIAGNOSIS — E781 Pure hyperglyceridemia: Secondary | ICD-10-CM | POA: Diagnosis not present

## 2016-05-04 LAB — POCT GLYCOSYLATED HEMOGLOBIN (HGB A1C): Hemoglobin A1C: 5.9

## 2016-05-04 MED ORDER — LISINOPRIL 5 MG PO TABS
5.0000 mg | ORAL_TABLET | Freq: Every day | ORAL | Status: DC
Start: 2016-05-04 — End: 2016-07-13

## 2016-05-04 NOTE — Progress Notes (Signed)
Name: Megan Gates   MRN: [id number]    DOB: 1941-11-18   Date:05/04/2016       Progress Note  Subjective  Chief Complaint  Chief Complaint  Patient presents with  . Hypertension    follow up  . Hyperlipidemia  . memory changes  . Diabetes    Hypertension This is a chronic problem. The problem is uncontrolled. Pertinent negatives include no chest pain, headaches, palpitations or shortness of breath. Past treatments include nothing. There is no history of kidney disease, CAD/MI or CVA.  Hyperlipidemia This is a chronic problem. The problem is uncontrolled. Recent lipid tests were reviewed and are high (Elevated Triglycerides.). Pertinent negatives include no chest pain, leg pain, myalgias or shortness of breath. Current antihyperlipidemic treatment includes statins.  Diabetes She presents for her follow-up diabetic visit. She has type 2 diabetes mellitus. Her disease course has been stable. Pertinent negatives for hypoglycemia include no headaches. Associated symptoms include fatigue, polydipsia and polyuria. Pertinent negatives for diabetes include no chest pain. Pertinent negatives for diabetic complications include no CVA. Current diabetic treatment includes diet. She is following a diabetic diet. She rarely participates in exercise. She monitors blood glucose at home 1-2 x per day. An ACE inhibitor/angiotensin II receptor blocker is not being taken. Eye exam is current.    Past Medical History  Diagnosis Date  . Hypertension   . Diabetes mellitus without complication (HCC)   . Seizures (HCC)   . Depression   . Hyperlipemia   . Peripheral vascular disease [facility])     Past Surgical History  Procedure Laterality Date  . Eye surgery    . Abdominal hysterectomy    . Shoulder surgery Left   . Peripheral vascular catheterization Right 12/22/2015    Procedure: Upper Extremity Angiography;  Surgeon: Annice Needy, MD;  Location: [facility] INVASIVE CV LAB;  Service: Cardiovascular;   Laterality: Right;  . Peripheral vascular catheterization  12/22/2015    Procedure: Upper Extremity Intervention;  Surgeon: Annice Needy, MD;  Location: [facility] INVAS[facility] CV [facility];  Service: Cardiovascular;;  . Peripheral vascular catheterization Right 05/01/2016    Procedure: Upper Extremity Angiography;  Surgeon: Annice Needy, MD;  Location: [facility] INVASIVE CV LAB;  Service: Cardiovascular;  Laterality: Right;  . Peripheral vascular catheterization  05/01/2016    Procedure: Upper Extremity Intervention;  Surgeon: Annice Needy, MD;  Location: [facility] INVASIVE CV LAB;  Service: Cardiovascular;;    History reviewed. No pertinent family history.  Social History   Social History  . Marital Status: Widowed    Spouse Name: N/A  . Number of Children: N/A  . Years of Education: N/A   Occupational History  . Not on file.   Social History Main Topics  . Smoking status: Current Some Day Smoker -- 0.50 packs/day for 62 years    Types: Cigarettes  . Smokeless tobacco: Former Neurosurgeon  . Alcohol Use: No  . Drug Use: No  . Sexual Activity: Not on file   Other Topics Concern  . Not on file   Social History Narrative     Current outpatient prescriptions:  .  Armodafinil 150 MG tablet, Take 150 mg by mouth daily. Reported on 05/01/2016, Disp: , Rfl:  .  aspirin 325 MG tablet, Take 325 mg by mouth daily. To start tonight or tomorrow and take daily per Dr. Wyn Quaker, Disp: , Rfl:  .  atorvastatin (LIPITOR) 40 MG tablet, TAKE 1 TABLET AT BEDTIME, Disp: 90 tablet, Rfl: 1 .  citalopram (  CELEXA) 20 MG tablet, TAKE 1 TABLET EVERY DAY, Disp: 90 tablet, Rfl: 1 .  donepezil (ARICEPT) 10 MG tablet, TAKE 1 TABLET AT BEDTIME, Disp: 90 tablet, Rfl: 1 .  fenofibrate (TRICOR) 145 MG tablet, TAKE 1 TABLET EVERY DAY, Disp: 90 tablet, Rfl: 1 .  ibuprofen (ADVIL,MOTRIN) 200 MG tablet, Take 200 mg by mouth every 6 (six) hours as needed. Pt takes one every morning, Disp: , Rfl:  .  imipramine (TOFRANIL) 25 MG tablet, TAKE 3 TABLETS AT  BEDTIME, Disp: 270 tablet, Rfl: 1 .  meloxicam (MOBIC) 15 MG tablet, TAKE 1 TABLET EVERY DAY, Disp: 90 tablet, Rfl: 1 .  NAMENDA XR 28 MG CP24 24 hr capsule, TAKE 1 CAPSULE EVERY DAY, Disp: 90 capsule, Rfl: 1 .  omeprazole (PRILOSEC) 20 MG capsule, TAKE 1 CAPSULE EVERY DAY, Disp: 90 capsule, Rfl: 1  No Known Allergies   Review of Systems  Constitutional: Positive for fatigue.  Respiratory: Negative for shortness of breath.   Cardiovascular: Negative for chest pain and palpitations.  Musculoskeletal: Negative for myalgias.  Neurological: Negative for headaches.  Endo/Heme/Allergies: Positive for polydipsia.    Objective  Filed Vitals:   05/04/16 1017  BP: 160/72  Pulse: 88  Temp: 98.7 F (37.1 C)  TempSrc: Oral  Resp: 18  Height: 5\' 4"  (1.626 m)  Weight: 195 lb 6.4 oz (88.633 kg)  SpO2: 97%    Physical Exam  Constitutional: She is well-developed, well-nourished, and in no distress.  HENT:  Head: Normocephalic and atraumatic.  Cardiovascular: Normal rate and regular rhythm.   Pulmonary/Chest: Effort normal and breath sounds normal.  Abdominal: Soft. Bowel sounds are normal.  Musculoskeletal:       Right ankle: She exhibits no swelling.       Left ankle: She exhibits no swelling.  Neurological: She is alert.  Psychiatric: She has a flat affect.  Nursing note and vitals reviewed.      Assessment & Plan  1. Controlled type 2 diabetes mellitus without complication, without long-term current use of insulin (HCC) A1c is at goal, no pharmacotherapy indicated at this time. - POCT HgB A1C - POCT UA - Microalbumin  2. Essential hypertension Blood pressure is poorly controlled, will add lisinopril 5 mg for antihypertensive effect and renal protection. Recheck blood pressure in one month - lisinopril (PRINIVIL,ZESTRIL) 5 MG tablet; Take 1 tablet (5 mg total) by mouth daily.  Dispense: 90 tablet; Refill: 3  3. Hypertriglyceridemia Obtaining FLP for evaluation of  triglycerides. Continue on fenofibrate - Lipid Profile   Alaia Lordi Asad A. Faylene KurtzShah [facility] 05/04/2016 10:25 AM

## 2016-05-05 LAB — MICROALBUMIN / CREATININE URINE RATIO
CREATININE, UR: 196.8 mg/dL
MICROALB/CREAT RATIO: 10.9 mg/g creat (ref 0.0–30.0)
MICROALBUM., U, RANDOM: 21.5 ug/mL

## 2016-05-17 ENCOUNTER — Telehealth: Payer: Self-pay | Admitting: Family Medicine

## 2016-05-17 NOTE — Telephone Encounter (Signed)
DR Thana AtesMORRISEY PATIENT: Tamika Life from [facility] wanted to make you aware of a situation. States that when she spoke with Ms Kathie RhodesBetty, she could not remember having or going to an appointment for 04-26-16. I informed Tamika that appointment was cancelled but she was seen on a different date. That's when she stated that Ms Kathie RhodesBetty does not remember coming to that appointment either. The patient is also very confused about her medications. She is not sure what her medications are, said that most of her bottles does not have labels on them. [place] is concerned about Ms Kathie RhodesBetty memory and will be sending out a Insurance underwriterfield care manager out to sit with the patient to help her figure out her prescriptions. Tamika also stated that she will be speaking with the family about having home health come in maybe 1-2 times a week to help. i also informed Tamika that patient has appointment for 06-05-16. If you have any questions please call Tam[facility] from [facility] at [phone] ext [phone]

## 2016-05-17 NOTE — Telephone Encounter (Signed)
Called Tamika Life from [facility] to discuss her concerns about this patient and left a voice message with instructions to return our call

## 2016-05-18 ENCOUNTER — Telehealth: Payer: Self-pay | Admitting: Family Medicine

## 2016-05-18 NOTE — Telephone Encounter (Signed)
Please contact Ward ChattersMargaret Booker regarding message for Vincente LibertyBetty Barmore

## 2016-05-18 NOTE — Telephone Encounter (Signed)
PER [name] NEEDS YOU TO CALL HER ABOUT Megan Gates. SHE WAS SEEN AND THEY HAD TOOK HER OFF HER SEIZURE MEDICATION AND NOW SHE IS BACK TO HAVING THEM . HAD 3 IN THE GROCERY STORE YESTERDAY AND HAS HAD SEVERAL OTHER DAYS. PLEASE CALL AND SPEAK WITH [name].

## 2016-05-18 NOTE — Telephone Encounter (Signed)
Called Mrs. Ward ChattersMargaret Booker and left a voice message with instructions to return our call

## 2016-05-30 ENCOUNTER — Telehealth: Payer: Self-pay | Admitting: Family Medicine

## 2016-05-30 ENCOUNTER — Encounter: Payer: Self-pay | Admitting: Family Medicine

## 2016-05-30 ENCOUNTER — Ambulatory Visit (INDEPENDENT_AMBULATORY_CARE_PROVIDER_SITE_OTHER): Payer: Medicare PPO | Admitting: Family Medicine

## 2016-05-30 VITALS — BP 132/70 | HR 99 | Temp 99.2°F | Resp 17 | Ht 64.0 in | Wt 189.5 lb

## 2016-05-30 DIAGNOSIS — R2990 Unspecified symptoms and signs involving the nervous system: Secondary | ICD-10-CM | POA: Diagnosis not present

## 2016-05-30 DIAGNOSIS — R011 Cardiac murmur, unspecified: Secondary | ICD-10-CM

## 2016-05-30 NOTE — Progress Notes (Signed)
Name: Megan NewcomerBetty Lou Bushnell   MRN: [id number]    DOB: 1941-02-22   Date:05/30/2016       Progress Note  Subjective  Chief Complaint  Chief Complaint  Patient presents with  . Follow-up    1  mo  . Diabetes  . Hyperlipidemia    Fall The accident occurred more than 1 week ago (Fell down in the store 2 weeks ago, accompanied by niece who reports pt. started mumbling some incoherent words, slowly slid down  on the floo, no shaking of head or movement of limbs before the fall.). The fall occurred while standing. She fell from a height of 3 to 5 ft. She landed on hard floor. The pain is at a severity of 0/10. The patient is experiencing no pain. Pertinent negatives include no headaches or loss of consciousness.    Past Medical History  Diagnosis Date  . Hypertension   . Diabetes mellitus without complication (HCC)   . Seizures (HCC)   . Depression   . Hyperlipemia   . Peripheral vascular disease [facility](HCC)     Past Surgical History  Procedure Laterality Date  . Eye surgery    . Abdominal hysterectomy    . Shoulder surgery Left   . Peripheral vascular catheterization Right 12/22/2015    Procedure: Upper Extremity Angiography;  Surgeon: Annice NeedyJason S Dew, MD;  Location: [facility] INVAS[facility] CV [facility];  Service: Cardiovascular;  Laterality: Right;  . Peripheral vascular catheterization  12/22/2015    Procedure: Upper Extremity Intervention;  Surgeon: Annice NeedyJason S Dew, MD;  Location: [facility];  Service: Cardiovascular;;  . Peripheral vascular catheterization Right 05/01/2016    Procedure: Upper Extremity Angiography;  Surgeon: Annice NeedyJason S Dew, MD;  Location: [facility];  Service: Cardiovascular;  Laterality: Right;  . Peripheral vascular catheterization  05/01/2016    Procedure: Upper Extremity Intervention;  Surgeon: Annice NeedyJason S Dew, MD;  Location: [facility] INV[facility];  Service: Cardiovascular;;    History reviewed. No pertinent family history.  Social History   Social History  . Marital Status:  Widowed    Spouse Name: N/A  . Number of Children: N/A  . Years of Education: N/A   Occupational History  . Not on file.   Social History Main Topics  . Smoking status: Current Some Day Smoker -- 0.50 packs/day for 62 years    Types: Cigarettes  . Smokeless tobacco: Former NeurosurgeonUser  . Alcohol Use: No  . Drug Use: No  . Sexual Activity: Not on file   Other Topics Concern  . Not on file   Social History Narrative     Current outpatient prescriptions:  .  Armodafinil 150 MG tablet, Take 150 mg by mouth daily. Reported on 05/01/2016, Disp: , Rfl:  .  aspirin 325 MG tablet, Take 325 mg by mouth daily. To start tonight or tomorrow and take daily per Dr. Wyn Quakerew, Disp: , Rfl:  .  atorvastatin (LIPITOR) 40 MG tablet, TAKE 1 TABLET AT BEDTIME, Disp: 90 tablet, Rfl: 1 .  citalopram (CELEXA) 20 MG tablet, TAKE 1 TABLET EVERY DAY, Disp: 90 tablet, Rfl: 1 .  donepezil (ARICEPT) 10 MG tablet, TAKE 1 TABLET AT BEDTIME, Disp: 90 tablet, Rfl: 1 .  fenofibrate (TRICOR) 145 MG tablet, TAKE 1 TABLET EVERY DAY, Disp: 90 tablet, Rfl: 1 .  ibuprofen (ADVIL,MOTRIN) 200 MG tablet, Take 200 mg by mouth every 6 (six) hours as needed. Pt takes one every morning, Disp: , Rfl:  .  imipramine (TOFRANIL) 25  MG tablet, TAKE 3 TABLETS AT BEDTIME, Disp: 270 tablet, Rfl: 1 .  lisinopril (PRINIVIL,ZESTRIL) 5 MG tablet, Take 1 tablet (5 mg total) by mouth daily., Disp: 90 tablet, Rfl: 3 .  meloxicam (MOBIC) 15 MG tablet, TAKE 1 TABLET EVERY DAY, Disp: 90 tablet, Rfl: 1 .  NAMENDA XR 28 MG CP24 24 hr capsule, TAKE 1 CAPSULE EVERY DAY, Disp: 90 capsule, Rfl: 1 .  omeprazole (PRILOSEC) 20 MG capsule, TAKE 1 CAPSULE EVERY DAY, Disp: 90 capsule, Rfl: 1  No Known Allergies   Review of Systems  Eyes: Negative for blurred vision and double vision.  Musculoskeletal: Negative for neck pain.  Neurological: Positive for seizures (has history of seizure disorder according to her chart, but not taking an AED.). Negative for  dizziness, sensory change, focal weakness, loss of consciousness and headaches.      Objective  Filed Vitals:   05/30/16 1345  BP: 132/70  Pulse: 99  Temp: 99.2 F (37.3 C)  TempSrc: Oral  Resp: 17  Height:  (1.626 m)  Weight: 189 lb 8 oz (85.957 kg)  SpO2: 96%    Physical Exam  Constitutional: She is oriented to person, place, and time and well-developed, well-nourished, and in no distress.  HENT:  Head: Normocephalic and atraumatic.  Eyes: Pupils are equal, round, and reactive to light.  Cardiovascular: Normal rate, regular rhythm, S1 normal and S2 normal.   Murmur heard.  Systolic murmur is present with a grade of 2/6  Pulmonary/Chest: Breath sounds normal. She has no wheezes. She has no rhonchi.  Abdominal: Normal appearance and bowel sounds are normal. There is no tenderness.  Neurological: She is alert and oriented to person, place, and time. She has normal sensation and normal strength. She has a normal Romberg Test.  Nursing note and vitals reviewed.     Assessment & Plan  1. Stroke-like symptoms Unclear etiology of patient's multiple falls without loss of consciousness. Neurological exam is unremarkable. Concern for CVA versus TIA versus seizure disorder. Patient has multiple risk factors for stroke. Obtain MRI of brain and consider referral to neurology to obtain EEG. - MR Brain Wo Contrast; Future  2. Heart murmur  - Ambulatory referral to Cardiology   [facility] A. Fay[facility] 05/30/2016 2:24 PM

## 2016-05-30 NOTE — Telephone Encounter (Signed)
Returning your call for about 2 wks ago. Please call about this patient . Call [facility] AT [phone] EXT [phone]. SHE CALLED YOU BACK ON THE 7TH ABOUT THE PATIENTS SYMPTOMS

## 2016-05-30 NOTE — Telephone Encounter (Signed)
Left a voice message for [facility] from [facility]. Patient was seen today and evaluated for a fall that she experienced 2 weeks ago.

## 2016-06-05 ENCOUNTER — Ambulatory Visit: Payer: Medicare PPO | Admitting: Family Medicine

## 2016-06-24 ENCOUNTER — Ambulatory Visit
Admission: RE | Admit: 2016-06-24 | Discharge: 2016-06-24 | Disposition: A | Discharge status: Home / Self Care | Payer: Medicare PPO | Source: Ambulatory Visit | Attending: Family Medicine | Admitting: Family Medicine

## 2016-06-24 DIAGNOSIS — R2990 Unspecified symptoms and signs involving the nervous system: Secondary | ICD-10-CM | POA: Diagnosis present

## 2016-06-24 DIAGNOSIS — I638 Other cerebral infarction: Secondary | ICD-10-CM | POA: Diagnosis not present

## 2016-06-24 DIAGNOSIS — G939 Disorder of brain, unspecified: Secondary | ICD-10-CM | POA: Insufficient documentation

## 2016-06-26 ENCOUNTER — Other Ambulatory Visit: Payer: Self-pay | Admitting: Family Medicine

## 2016-06-26 ENCOUNTER — Telehealth: Payer: Self-pay | Admitting: Emergency Medicine

## 2016-06-26 DIAGNOSIS — I639 Cerebral infarction, unspecified: Secondary | ICD-10-CM

## 2016-06-26 NOTE — Telephone Encounter (Signed)
Patient notified and her sister Ward ChattersMargaret Booker notified. Tasha to call with appointment

## 2016-06-29 ENCOUNTER — Ambulatory Visit: Payer: Medicare PPO | Admitting: Family Medicine

## 2016-07-04 ENCOUNTER — Encounter: Payer: Self-pay | Admitting: Cardiovascular Disease

## 2016-07-04 ENCOUNTER — Ambulatory Visit (INDEPENDENT_AMBULATORY_CARE_PROVIDER_SITE_OTHER): Payer: Medicare PPO | Admitting: Cardiovascular Disease

## 2016-07-04 VITALS — BP 140/70 | HR 86 | Ht 64.0 in | Wt 186.6 lb

## 2016-07-04 DIAGNOSIS — I10 Essential (primary) hypertension: Secondary | ICD-10-CM

## 2016-07-04 DIAGNOSIS — R011 Cardiac murmur, unspecified: Secondary | ICD-10-CM

## 2016-07-04 DIAGNOSIS — I739 Peripheral vascular disease, unspecified: Secondary | ICD-10-CM

## 2016-07-04 NOTE — Progress Notes (Signed)
Appointment made for [facility] Neurology

## 2016-07-04 NOTE — Patient Instructions (Signed)
Medication Instructions:  Your physician recommends that you continue on your current medications as directed. Please refer to the Current Medication list given to you today.   Labwork: None ordered  Testing/Procedures: Your physician has requested that you have an echocardiogram. Echocardiography is a painless test that uses sound waves to create images of your heart. It provides your doctor with information about the size and shape of your heart and how well your heart's chambers and valves are working. This procedure takes approximately one hour. There are no restrictions for this procedure.    Follow-Up: Your physician recommends that you schedule a follow-up appointment as needed   Any Other Special Instructions Will Be Listed Below (If Applicable). Please call or go to Dr.Dews office for an appointment asap     If you need a refill on your cardiac medications before your next appointment, please call your pharmacy.

## 2016-07-04 NOTE — Progress Notes (Signed)
Cardiology Office Note   Date:  07/04/2016   ID:  Megan Gates, DOB 16-Apr-1941, MRN [id number]  PCP:  Dennison Mascot, MD  Cardiologist:   Lorine Bears, MD   Chief Complaint  Patient presents with  . Heart Murmur    some headaches & falling, per pt  . Hypertension      History of Present Illness: Megan Gates is a 75 y.o. female who Was referred by Dr. Sherryll Burger for evaluation of a cardiac murmur. She has no previous cardiac history. She has multiple chronic medical conditions that include hyperlipidemia, hypertension, dementia, tobacco use, peripheral arterial disease and recent strokes noted on MRI. The patient is overall a poor historian but she is accompanied by her sister who helps with the history. She had a nonhealing ulcer on the right second finger in may and was seen by Dr. dew. She underwent angiography which showed occluded radial and ulnar arteries. Angioplasty was done on the radial artery. The patient now reports recurrent ulceration with severe pain affecting the right middle finger. In June, she was walking in the store and fell with noticeable problems with balance at that time and tremors. Her family couldn't tell if she had some seizure activities. She had a brain MRI done which showed extensive chronic ischemic changes within the brainstem and thalami with remote coronary infarcts as well as acute on subacute infarct in the pons and anterior medial frontal lobe The patient denies any chest pain or shortness of breath. Her functional capacity is very limited due to poor balance.    Past Medical History:  Diagnosis Date  . Depression   . Diabetes mellitus without complication (HCC)   . Hyperlipemia   . Hypertension   . Peripheral vascular disease (HCC)   . Seizures (HCC)     Past Surgical History:  Procedure Laterality Date  . ABDOMINAL HYSTERECTOMY    . EYE SURGERY    . PERIPHERAL VASCULAR CATHETERIZATION Right 12/22/2015   Procedure: Upper  Extremity Angiography;  Surgeon: Annice Needy, MD;  Location: [facility];  Service: Cardiovascular;  Laterality: Right;  . PERIPHERAL VASCULAR CATHETERIZATION  12/22/2015   Procedure: Upper Extremity Intervention;  Surgeon: Annice Needy, MD;  Location: [facility];  Service: Cardiovascular;;  . PERIPHERAL VASCULAR CATHETERIZATION Right 05/01/2016   Procedure: Upper Extremity Angiography;  Surgeon: Annice Needy, MD;  Location: [facility];  Service: Cardiovascular;  Laterality: Right;  . PERIPHERAL VASCULAR CATHETERIZATION  05/01/2016   Procedure: Upper Extremity Intervention;  Surgeon: Annice Needy, MD;  Location: [facility];  Service: Cardiovascular;;  . SHOULDER SURGERY Left      Current Outpatient Prescriptions  Medication Sig Dispense Refill  . aspirin 325 MG tablet Take 325 mg by mouth daily. To start tonight or tomorrow and take daily per Dr. Wyn Quaker    . atorvastatin (LIPITOR) 40 MG tablet Take 40 mg by mouth daily.    . citalopram (CELEXA) 20 MG tablet TAKE 1 TABLET EVERY DAY 90 tablet 1  . donepezil (ARICEPT) 10 MG tablet TAKE 1 TABLET AT BEDTIME 90 tablet 1  . fenofibrate (TRICOR) 145 MG tablet TAKE 1 TABLET EVERY DAY 90 tablet 1  . ibuprofen (ADVIL,MOTRIN) 200 MG tablet Take 200 mg by mouth every 6 (six) hours as needed. Pt takes one every morning    . imipramine (TOFRANIL) 25 MG tablet TAKE 3 TABLETS AT BEDTIME 270 tablet 1  . lisinopril (PRINIVIL,ZESTRIL) 5 MG  tablet Take 1 tablet (5 mg total) by mouth daily. 90 tablet 3  . meloxicam (MOBIC) 15 MG tablet TAKE 1 TABLET EVERY DAY 90 tablet 1  . NAMENDA XR 28 MG CP24 24 hr capsule TAKE 1 CAPSULE EVERY DAY 90 capsule 1  . omeprazole (PRILOSEC) 20 MG capsule TAKE 1 CAPSULE EVERY DAY 90 capsule 1   No current facility-administered medications for this visit.     Allergies:   Review of patient's allergies indicates no known allergies.    Social History:  The patient  reports that she has quit smoking.  Her smoking use included Cigarettes. She has a 31.00 pack-year smoking history. She has never used smokeless tobacco. She reports that she does not drink alcohol or use drugs.   Family History:  The patient's family history includes Cancer in her mother.    ROS:  Please see the history of present illness.   Otherwise, review of systems are positive for none.   All other systems are reviewed and negative.    PHYSICAL EXAM: VS:  BP 140/70 (BP Location: Left Arm, Patient Position: Sitting, Cuff Size: Large)   Pulse 86   Ht 5\' 4"  (1.626 m)   Wt 186 lb 9.6 oz (84.6 kg)   SpO2 93%   BMI 32.03 kg/m  , BMI Body mass index is 32.03 kg/m. GEN: Well nourished, well developed, in no acute distress  HEENT: normal  Neck: no JVD, carotid bruits, or masses Cardiac: RRR; no rubs, or gallops,no edema . There is 2/6 crescendo decrescendo systolic murmur in the aortic area which is early peaking Respiratory:  clear to auscultation bilaterally, normal work of breathing GI: soft, nontender, nondistended, + BS MS: no deformity or atrophy  Skin: warm and dry, no rash Neuro:  Strength and sensation are intact Psych: euthymic mood, full affect Vascular: Right radial and ulnar pulses are not palpable. There are faint on the left side. She has a small ulceration on the tip of the right middle finger  EKG:  EKG is ordered today. The ekg ordered today demonstrates normal sinus rhythm with possible left atrial enlargement.   Recent Labs: 05/01/2016: BUN 24; Creatinine, Ser 0.89; Potassium 4.1; Sodium 141    Lipid Panel    Component Value Date/Time   CHOL 126 05/19/2015 1123   TRIG 246 (H) 05/19/2015 1123   HDL 43 05/19/2015 1123   CHOLHDL 2.9 05/19/2015 1123   LDLCALC 34 05/19/2015 1123      Wt Readings from Last 3 Encounters:  07/04/16 186 lb 9.6 oz (84.6 kg)  05/30/16 189 lb 8 oz (86 kg)  05/04/16 195 lb 6.4 oz (88.6 kg)        ASSESSMENT AND PLAN:  1.  Cardiac murmur: The murmur is  suggestive of aortic sclerosis  or mild stenosis. I requested an echocardiogram for evaluation especially with her recent strokes.  2. Right hand critical limb ischemia: The patient underwent angioplasty of the right renal artery by Dr. Wyn Quaker in May and she is now having recurrent ulceration on the middle finger. 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 I advised her to schedule an urgent appointment with Dr. Wyn Quaker.  I strongly advised her to quit smoking.  3. Recent stroke noted on MRI in June:  she is going to see Dr. Sherryll Burger in neurology in the near future. If these are felt to be embolic in origin, that I recommend a 30 day outpatient telemetry to ensure no underlying atrial fibrillation.   4. Tobacco use:  Discussed with the patient the importance of smoking cessation.  Disposition:   FU with me as needed.   Signed,  Lorine Bears, MD  07/04/2016 2:49 PM    [facility]

## 2016-07-11 ENCOUNTER — Other Ambulatory Visit: Payer: Self-pay | Admitting: Vascular Surgery

## 2016-07-13 ENCOUNTER — Other Ambulatory Visit: Payer: Self-pay | Admitting: Emergency Medicine

## 2016-07-13 DIAGNOSIS — I10 Essential (primary) hypertension: Secondary | ICD-10-CM

## 2016-07-13 MED ORDER — LISINOPRIL 5 MG PO TABS: 5.0000 mg | ORAL_TABLET | Freq: Every day | ORAL | 3 refills | Status: AC

## 2016-07-14 ENCOUNTER — Other Ambulatory Visit
Admission: RE | Admit: 2016-07-14 | Discharge: 2016-07-14 | Disposition: A | Discharge status: Home / Self Care | Payer: Medicare PPO | Source: Ambulatory Visit | Attending: Vascular Surgery | Admitting: Vascular Surgery

## 2016-07-14 DIAGNOSIS — Z0000 Encounter for general adult medical examination without abnormal findings: Secondary | ICD-10-CM | POA: Insufficient documentation

## 2016-07-14 LAB — BUN: BUN: 28 mg/dL — ABNORMAL HIGH (ref 6–20)

## 2016-07-14 LAB — CREATININE, SERUM
Creatinine, Ser: 1.17 mg/dL — ABNORMAL HIGH (ref 0.44–1.00)
GFR calc non Af Amer: 44 mL/min — ABNORMAL LOW (ref >60)
GFR, EST AFRICAN AMERICAN: 51 mL/min — AB (ref >60)

## 2016-07-17 ENCOUNTER — Encounter
Admission: RE | Disposition: A | Discharge status: Home / Self Care | Payer: Self-pay | Source: Ambulatory Visit | Attending: Vascular Surgery

## 2016-07-17 ENCOUNTER — Ambulatory Visit
Admission: RE | Admit: 2016-07-17 | Discharge: 2016-07-17 | Disposition: A | Discharge status: Home / Self Care | Payer: Medicare PPO | Source: Ambulatory Visit | Attending: Vascular Surgery | Admitting: Vascular Surgery

## 2016-07-17 ENCOUNTER — Encounter: Payer: Self-pay | Admitting: *Deleted

## 2016-07-17 DIAGNOSIS — I739 Peripheral vascular disease, unspecified: Secondary | ICD-10-CM | POA: Insufficient documentation

## 2016-07-17 DIAGNOSIS — Z8673 Personal history of transient ischemic attack (TIA), and cerebral infarction without residual deficits: Secondary | ICD-10-CM | POA: Insufficient documentation

## 2016-07-17 DIAGNOSIS — E119 Type 2 diabetes mellitus without complications: Secondary | ICD-10-CM | POA: Diagnosis not present

## 2016-07-17 DIAGNOSIS — I10 Essential (primary) hypertension: Secondary | ICD-10-CM | POA: Diagnosis not present

## 2016-07-17 DIAGNOSIS — R011 Cardiac murmur, unspecified: Secondary | ICD-10-CM | POA: Insufficient documentation

## 2016-07-17 DIAGNOSIS — Z87891 Personal history of nicotine dependence: Secondary | ICD-10-CM | POA: Insufficient documentation

## 2016-07-17 DIAGNOSIS — F329 Major depressive disorder, single episode, unspecified: Secondary | ICD-10-CM | POA: Diagnosis not present

## 2016-07-17 DIAGNOSIS — Z7982 Long term (current) use of aspirin: Secondary | ICD-10-CM | POA: Diagnosis not present

## 2016-07-17 DIAGNOSIS — Z90710 Acquired absence of both cervix and uterus: Secondary | ICD-10-CM | POA: Diagnosis not present

## 2016-07-17 DIAGNOSIS — R569 Unspecified convulsions: Secondary | ICD-10-CM | POA: Diagnosis not present

## 2016-07-17 DIAGNOSIS — I70268 Atherosclerosis of native arteries of extremities with gangrene, other extremity: Secondary | ICD-10-CM | POA: Insufficient documentation

## 2016-07-17 DIAGNOSIS — E785 Hyperlipidemia, unspecified: Secondary | ICD-10-CM | POA: Insufficient documentation

## 2016-07-17 HISTORY — PX: PERIPHERAL VASCULAR CATHETERIZATION: SHX172C

## 2016-07-17 LAB — GLUCOSE, CAPILLARY: GLUCOSE-CAPILLARY: 73 mg/dL (ref 65–99)

## 2016-07-17 SURGERY — UPPER EXTREMITY ANGIOGRAPHY
Anesthesia: Moderate Sedation | Laterality: Right

## 2016-07-17 MED ORDER — PHENOL 1.4 % MT LIQD: 1.0000 | OROMUCOSAL | Status: DC | PRN

## 2016-07-17 MED ORDER — LIDOCAINE-EPINEPHRINE (PF) 1 %-1:200000 IJ SOLN
INTRAMUSCULAR | Status: AC
Start: 2016-07-17 — End: 2016-07-17
  Filled 2016-07-17: qty 30

## 2016-07-17 MED ORDER — ONDANSETRON HCL 4 MG/2ML IJ SOLN
4.0000 mg | Freq: Four times a day (QID) | INTRAMUSCULAR | Status: DC | PRN
Start: 2016-07-17 — End: 2016-07-17

## 2016-07-17 MED ORDER — FENTANYL CITRATE (PF) 100 MCG/2ML IJ SOLN
INTRAMUSCULAR | Status: AC
  Filled 2016-07-17: qty 2

## 2016-07-17 MED ORDER — ACETAMINOPHEN 325 MG PO TABS: 325.0000 mg | ORAL_TABLET | ORAL | Status: DC | PRN

## 2016-07-17 MED ORDER — SODIUM CHLORIDE 0.9 % IV SOLN: 500.0000 mL | Freq: Once | INTRAVENOUS | Status: DC | PRN

## 2016-07-17 MED ORDER — GUAIFENESIN-DM 100-10 MG/5ML PO SYRP: 15.0000 mL | ORAL_SOLUTION | ORAL | Status: DC | PRN

## 2016-07-17 MED ORDER — SODIUM CHLORIDE 0.9 % IJ SOLN
INTRAMUSCULAR | Status: AC
Start: 2016-07-17 — End: 2016-07-17
  Filled 2016-07-17: qty 50

## 2016-07-17 MED ORDER — FENTANYL CITRATE (PF) 100 MCG/2ML IJ SOLN
INTRAMUSCULAR | Status: DC | PRN
  Administered 2016-07-17 (×2): 50 ug via INTRAVENOUS
  Administered 2016-07-17 (×3): 25 ug via INTRAVENOUS

## 2016-07-17 MED ORDER — HEPARIN (PORCINE) IN NACL 2-0.9 UNIT/ML-% IJ SOLN
INTRAMUSCULAR | Status: AC
  Filled 2016-07-17: qty 1000

## 2016-07-17 MED ORDER — HYDRALAZINE HCL 20 MG/ML IJ SOLN: 5.0000 mg | INTRAMUSCULAR | Status: DC | PRN

## 2016-07-17 MED ORDER — HYDROMORPHONE HCL 1 MG/ML IJ SOLN: 1.0000 mg | Freq: Once | INTRAMUSCULAR | Status: DC

## 2016-07-17 MED ORDER — OXYCODONE-ACETAMINOPHEN 5-325 MG PO TABS: 1.0000 | ORAL_TABLET | ORAL | Status: DC | PRN

## 2016-07-17 MED ORDER — NITROGLYCERIN 5 MG/ML IV SOLN
INTRAVENOUS | Status: AC
  Filled 2016-07-17: qty 10

## 2016-07-17 MED ORDER — MIDAZOLAM HCL 2 MG/2ML IJ SOLN
INTRAMUSCULAR | Status: AC
  Filled 2016-07-17: qty 2

## 2016-07-17 MED ORDER — HEPARIN SODIUM (PORCINE) 1000 UNIT/ML IJ SOLN
INTRAMUSCULAR | Status: DC | PRN
  Administered 2016-07-17: 2000 [IU] via INTRAVENOUS
  Administered 2016-07-17: 3000 [IU] via INTRAVENOUS

## 2016-07-17 MED ORDER — FAMOTIDINE 20 MG PO TABS: 40.0000 mg | ORAL_TABLET | ORAL | Status: DC | PRN

## 2016-07-17 MED ORDER — HYDROMORPHONE HCL 1 MG/ML IJ SOLN: 0.5000 mg | INTRAMUSCULAR | Status: DC | PRN

## 2016-07-17 MED ORDER — MIDAZOLAM HCL 5 MG/5ML IJ SOLN
INTRAMUSCULAR | Status: AC
  Filled 2016-07-17: qty 5

## 2016-07-17 MED ORDER — IOPAMIDOL (ISOVUE-300) INJECTION 61%
INTRAVENOUS | Status: DC | PRN
  Administered 2016-07-17: 60 mL via INTRA_ARTERIAL

## 2016-07-17 MED ORDER — LABETALOL HCL 5 MG/ML IV SOLN: 10.0000 mg | INTRAVENOUS | Status: DC | PRN

## 2016-07-17 MED ORDER — ACETAMINOPHEN 325 MG RE SUPP
325.0000 mg | RECTAL | Status: DC | PRN
  Filled 2016-07-17: qty 2

## 2016-07-17 MED ORDER — MIDAZOLAM HCL 2 MG/2ML IJ SOLN
INTRAMUSCULAR | Status: DC | PRN
  Administered 2016-07-17: 1 mg via INTRAVENOUS
  Administered 2016-07-17: 2 mg via INTRAVENOUS
  Administered 2016-07-17 (×3): 1 mg via INTRAVENOUS

## 2016-07-17 MED ORDER — SODIUM CHLORIDE 0.9 % IV SOLN
INTRAVENOUS | Status: DC
  Administered 2016-07-17: 07:00:00 via INTRAVENOUS

## 2016-07-17 MED ORDER — METHYLPREDNISOLONE SODIUM SUCC 125 MG IJ SOLR: 125.0000 mg | INTRAMUSCULAR | Status: DC | PRN

## 2016-07-17 MED ORDER — HEPARIN SODIUM (PORCINE) 1000 UNIT/ML IJ SOLN
INTRAMUSCULAR | Status: AC
Start: 2016-07-17 — End: 2016-07-17
  Filled 2016-07-17: qty 1

## 2016-07-17 MED ORDER — METOPROLOL TARTRATE 5 MG/5ML IV SOLN: 2.0000 mg | INTRAVENOUS | Status: DC | PRN

## 2016-07-17 MED ORDER — ONDANSETRON HCL 4 MG/2ML IJ SOLN: 4.0000 mg | Freq: Four times a day (QID) | INTRAMUSCULAR | Status: DC | PRN

## 2016-07-17 MED ORDER — CEFUROXIME SODIUM 1.5 G IJ SOLR
1.5000 g | INTRAMUSCULAR | Status: AC
  Administered 2016-07-17: 1.5 g via INTRAVENOUS

## 2016-07-17 MED ORDER — CLOPIDOGREL BISULFATE 75 MG PO TABS: 75.0000 mg | ORAL_TABLET | Freq: Every day | ORAL | 11 refills | Status: AC

## 2016-07-17 SURGICAL SUPPLY — 21 items
BALLN ULTRVRSE 2.5X220X150 (BALLOONS) ×4
BALLN ULTRVRSE 2X150X150 (BALLOONS) ×2
BALLN ULTRVRSE 2X150X150 OTW (BALLOONS) ×2
BALLOON ULTRVRSE 2.5X220X150 (BALLOONS) ×2 IMPLANT
BALLOON ULTRVRSE 2X150X150 OTW (BALLOONS) ×2 IMPLANT
CATH ANGIO 5F 100CM .035 PIG (CATHETERS) ×4 IMPLANT
CATH CXI SUPP ANG 4FR 135 (MICROCATHETER) ×2 IMPLANT
CATH CXI SUPP ANG 4FR 135CM (MICROCATHETER) ×4
CATH H1 100CM (CATHETERS) ×4 IMPLANT
DEVICE PRESTO INFLATION (MISCELLANEOUS) ×4 IMPLANT
DEVICE STARCLOSE SE CLOSURE (Vascular Products) ×4 IMPLANT
DEVICE TORQUE (MISCELLANEOUS) ×4 IMPLANT
GLIDEWIRE ADV .035X260CM (WIRE) ×4 IMPLANT
GLIDEWIRE ANGLED SS 035X260CM (WIRE) ×4 IMPLANT
PACK ANGIOGRAPHY (CUSTOM PROCEDURE TRAY) ×4 IMPLANT
SHEATH ANL 5FRX90 (SHEATH) ×4 IMPLANT
SHEATH BRITE TIP 5FRX11 (SHEATH) ×4 IMPLANT
SYR MEDRAD MARK V 150ML (SYRINGE) ×4 IMPLANT
TUBING CONTRAST HIGH PRESS 72 (TUBING) ×4 IMPLANT
WIRE G V18X300CM (WIRE) ×4 IMPLANT
WIRE J 3MM .035X145CM (WIRE) ×4 IMPLANT

## 2016-07-17 NOTE — Discharge Instructions (Signed)
Angiogram, Care After °Refer to this sheet in the next few weeks. These instructions provide you with information about caring for yourself after your procedure. Your health care provider may also give you more specific instructions. Your treatment has been planned according to current medical practices, but problems sometimes occur. Call your health care provider if you have any problems or questions after your procedure. °WHAT TO EXPECT AFTER THE PROCEDURE °After your procedure, it is typical to have the following: °· Bruising at the catheter insertion site that usually fades within 1-2 weeks. °· Blood collecting in the tissue (hematoma) that may be painful to the touch. It should usually decrease in size and tenderness within 1-2 weeks. °HOME CARE INSTRUCTIONS °· Take medicines only as directed by your health care provider. °· You may shower 24-48 hours after the procedure or as directed by your health care provider. Remove the bandage (dressing) and gently wash the site with plain soap and water. Pat the area dry with a clean towel. Do not rub the site, because this may cause bleeding. °· Do not take baths, swim, or use a hot tub until your health care provider approves. °· Check your insertion site every day for redness, swelling, or drainage. °· Do not apply powder or lotion to the site. °· Do not lift over 10 lb (4.5 kg) for 5 days after your procedure or as directed by your health care provider. °· Ask your health care provider when it is okay to: °¨ Return to work or school. °¨ Resume usual physical activities or sports. °¨ Resume sexual activity. °· Do not drive home if you are discharged the same day as the procedure. Have someone else drive you. °· You may drive 24 hours after the procedure unless otherwise instructed by your health care provider. °· Do not operate machinery or power tools for 24 hours after the procedure or as directed by your health care provider. °· If your procedure was done as an  outpatient procedure, which means that you went home the same day as your procedure, a responsible adult should be with you for the first 24 hours after you arrive home. °· Keep all follow-up visits as directed by your health care provider. This is important. °SEEK MEDICAL CARE IF: °· You have a fever. °· You have chills. °· You have increased bleeding from the catheter insertion site. Hold pressure on the site. °SEEK IMMEDIATE MEDICAL CARE IF: °· You have unusual pain at the catheter insertion site. °· You have redness, warmth, or swelling at the catheter insertion site. °· You have drainage (other than a small amount of blood on the dressing) from the catheter insertion site. °· The catheter insertion site is bleeding, and the bleeding does not stop after 30 minutes of holding steady pressure on the site. °· The area near or just beyond the catheter insertion site becomes pale, cool, tingly, or numb. °  °This information is not intended to replace advice given to you by your health care provider. Make sure you discuss any questions you have with your health care provider. °  °Document Released: 06/15/2005 Document Revised: 12/18/2014 Document Reviewed: 04/30/2013 °Elsevier Interactive Patient Education ©2016 [employer] ° °

## 2016-07-17 NOTE — Op Note (Signed)
OPERATIVE REPORT   PREOPERATIVE DIAGNOSIS: 1. 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 Same as above  PROCEDURE PERFORMED: 1. Ultrasound guidance vascular access to right femoral artery. 2. Catheter placement to right radial artery from right femoral approach. 3. Thoracic aortogram and selective right upper extremity angiogram including selective images of the radial artery. 4. Percutaneous transluminal angioplasty of the right radial artery all the way into the palmar arch with a 2 mm diameter angioplasty balloon distally and a 2.5 mm diameter angioplasty balloon to the wrist 5. StarClose closure device right femoral artery.  SURGEON: Jason S Dew, MD  ANESTHESIA: Local with moderate conscious sedation for proximally 45 minutes using 6 mg of Versed and 175 g of fentanyl.  BLOOD LOSS: Minimal.  FLUOROSCOPY TIME: 7.9 minutes  CONTRAST: 60 cc  INDICATION FOR PROCEDURE: This is a 75-year-old female who presented to our office with gangrenous fingertip on the right hand. She has had previous intervention for significant right upper extremity arterial disease but continues to smoke and has recurrent symptomsl. To further evaluate this to determine what options would be possible to treat the arterial insufficiency, angiogram of the right upper extremity is indicated. Risks and benefits are discussed. Informed consent was obtained.  DESCRIPTION OF PROCEDURE: The patient was brought to the vascular suite. Groins were shaved and prepped and sterile surgical field was created.The patient was administered monitored conscious sedation during a face-to-face encounter throughout the entirety of the procedure with my presence and supervision of the RN monitoring the vital signs, pulse oximetry, telemetry, and mental status. The right femoral head was localized with fluoroscopy and the right femoral artery was then visualized with ultrasound and found to  be widely patent. It was then accessed under direct ultrasound guidance without difficulty with a Seldinger needle and a permanent image was recorded. A J-wire and 5-French sheath were then placed. Pigtail catheter was placed into the ascending aorta and a thoracic aortogram was then performed in the LAO projection. This demonstrated normal origins to the great vessels without significant proximal stenoses and a normal configuration of the great vessels. The patient was given 3000 units of intravenous heparin and a headhunter catheter was used to selectively cannulate the innominate artery and then advanced into the right subclavian artery without difficulty. This was then sequentially advanced to the brachial artery and to the brachial bifurcation.   Imaging was performed which showed occlusion of both the radial and ulnar arteries with only an interosseous artery seen initially. The patient was given an additional 2000 units of intravenous heparin, and a 5 French 90 cm long sheath was placed over a Terumo advantage wire. I was then able to gain access to the occluded radial artery with the advantage wire and a CXI catheter and advanced a CXI catheter into the midportion of the artery. This demonstrated continued occlusion at the wrist with reconstitution of the palmar arch seen. I then exchanged for a 0.018 wire and was able to advance through the occlusion with the CXI catheter in the V 18 wire and confirm intraluminal flow in the palmar arch. The wire was then replaced and I began with treatment. A 2 mm diameter by 15 cm length angioplasty balloon was inflated all the way to the palmar arch back across the wrist into the distal radial artery. This was inflated to 14 atm for 1 minute. A 2.5 mm diameter by 22 cm length angioplasty balloon was then inflated from the origin of the radial   artery up to the radial artery near the wrist. This was inflated to 8 atm for 1 minute. The diagnostic catheter was  removed with the wire remaining in place initially but removed for completion imaging. Brisk flow was now seen through the radial artery with about a 30% residual stenosis in the mid to distal segment as well as about a 20-30% residual stenosis in the hand entering the palmar arch. The palmar arch was seen with brisk flow into the hand and no high-grade residual stenosis and I did not feel there was any further revascularization will would be of benefit having tried to treat her ulnar occlusion on 2 previous occasions unsuccessfully. The sheath was pulled back to the ipsilateral external iliac artery. Oblique arteriogram was performed of the right femoral artery and StarClose closure device deployed in the usual fashion with excellent hemostatic result. The patient tolerated the procedure well and was taken to the recovery room in stable condition.   DEW,JASON 07/17/2016 9:14 AM

## 2016-07-17 NOTE — H&P (Signed)
  [facility] History & Physical Update  The patient was interviewed and re-examined.  The patient's previous History and Physical has been reviewed and is unchanged.  There is no change in the plan of care. We plan to proceed with the scheduled procedure.  DEW,JASON, MD  07/17/2016, 8:01 AM

## 2016-07-19 ENCOUNTER — Other Ambulatory Visit: Payer: Self-pay | Admitting: Emergency Medicine

## 2016-07-19 DIAGNOSIS — R413 Other amnesia: Secondary | ICD-10-CM

## 2016-07-19 DIAGNOSIS — E1169 Type 2 diabetes mellitus with other specified complication: Secondary | ICD-10-CM

## 2016-07-20 ENCOUNTER — Ambulatory Visit (INDEPENDENT_AMBULATORY_CARE_PROVIDER_SITE_OTHER): Payer: Medicare PPO

## 2016-07-20 ENCOUNTER — Other Ambulatory Visit: Payer: Self-pay

## 2016-07-20 DIAGNOSIS — R011 Cardiac murmur, unspecified: Secondary | ICD-10-CM

## 2016-07-24 ENCOUNTER — Other Ambulatory Visit: Payer: Self-pay

## 2016-07-28 ENCOUNTER — Other Ambulatory Visit: Payer: Self-pay | Admitting: Emergency Medicine

## 2016-07-28 DIAGNOSIS — S6991XD Unspecified injury of right wrist, hand and finger(s), subsequent encounter: Secondary | ICD-10-CM

## 2016-07-31 ENCOUNTER — Encounter: Payer: Self-pay | Admitting: Family Medicine

## 2016-07-31 ENCOUNTER — Other Ambulatory Visit: Payer: Self-pay | Admitting: Family Medicine

## 2016-07-31 ENCOUNTER — Ambulatory Visit (INDEPENDENT_AMBULATORY_CARE_PROVIDER_SITE_OTHER): Payer: Medicare PPO | Admitting: Family Medicine

## 2016-07-31 VITALS — BP 128/76 | HR 68 | Temp 98.3°F | Resp 18

## 2016-07-31 DIAGNOSIS — I96 Gangrene, not elsewhere classified: Secondary | ICD-10-CM | POA: Diagnosis not present

## 2016-07-31 DIAGNOSIS — E785 Hyperlipidemia, unspecified: Secondary | ICD-10-CM | POA: Diagnosis not present

## 2016-07-31 DIAGNOSIS — I10 Essential (primary) hypertension: Secondary | ICD-10-CM

## 2016-07-31 NOTE — Progress Notes (Signed)
Name: Megan Gates   MRN: [id number]    DOB: 02-19-1941   Date:07/31/2016       Progress Note  Subjective  Chief Complaint  Chief Complaint  Patient presents with  . Hand Pain    right hand middle finger wound. Patient picking skin off. Home Health called patient need referral to wound care    HPI  Pt. Presents for evaluation of an ulcer on her right middle finger. According to the patient, the ulcer has been present for over a month, she had multiple angiographies of right upper extremity by Vascular surgery.  She was told by Vascular surgeon (Dr. Lucky Cowboy) that there is nothing else that he could do for her after her most recent angiography. Patient is not smoking now but has history of smoking.  Past Medical History:  Diagnosis Date  . Depression   . Diabetes mellitus without complication ([place])   . Hyperlipemia   . Hypertension   . Peripheral vascular disease ([place])   . Seizures ([place])     Past Surgical History:  Procedure Laterality Date  . ABDOMINAL HYSTERECTOMY    . EYE SURGERY    . PERIPHERAL VASCULAR CATHETERIZATION Right 12/22/2015   Procedure: Upper Extremity Angiography;  Surgeon: Algernon Huxley, MD;  Location: [facility];  Service: Cardiovascular;  Laterality: Right;  . PERIPHERAL VASCULAR CATHETERIZATION  12/22/2015   Procedure: Upper Extremity Intervention;  Surgeon: Algernon Huxley, MD;  Location: [facility];  Service: Cardiovascular;;  . PERIPHERAL VASCULAR CATHETERIZATION Right 05/01/2016   Procedure: Upper Extremity Angiography;  Surgeon: Algernon Huxley, MD;  Location: [place];  Service: Cardiovascular;  Laterality: Right;  . PERIPHERAL VASCULAR CATHETERIZATION  05/01/2016   Procedure: Upper Extremity Intervention;  Surgeon: Algernon Huxley, MD;  Location: [facility];  Service: Cardiovascular;;  . PERIPHERAL VASCULAR CATHETERIZATION Right 07/17/2016   Procedure: Upper Extremity Angiography;  Surgeon: Algernon Huxley, MD;  Location: [facility];  Service: Cardiovascular;  Laterality: Right;  . PERIPHERAL VASCULAR CATHETERIZATION  07/17/2016   Procedure: Upper Extremity Intervention;  Surgeon: Algernon Huxley, MD;  Location: [facility];  Service: Cardiovascular;;  . SHOULDER SURGERY Left     Family History  Problem Relation Age of Onset  . Cancer Mother     Social History   Social History  . Marital status: Widowed    Spouse name: N/A  . Number of children: N/A  . Years of education: N/A   Occupational History  . Not on file.   Social History Main Topics  . Smoking status: Former Smoker    Packs/day: 0.50    Years: 62.00    Types: Cigarettes  . Smokeless tobacco: Never Used  . Alcohol use No  . Drug use: No  . Sexual activity: Not on file   Other Topics Concern  . Not on file   Social History Narrative  . No narrative on file     Current Outpatient Prescriptions:  .  Armodafinil (NUVIGIL) 150 MG tablet, Take 150 mg by mouth daily., Disp: , Rfl:  .  aspirin 81 MG EC tablet, Take 81 mg by mouth daily. , Disp: , Rfl:  .  atorvastatin (LIPITOR) 40 MG tablet, Take 40 mg by mouth daily., Disp: , Rfl:  .  citalopram (CELEXA) 20 MG tablet, TAKE 1 TABLET EVERY DAY, Disp: 90 tablet, Rfl: 1 .  clopidogrel (PLAVIX) 75 MG tablet, Take 1 tablet (75 mg total)  by mouth daily., Disp: 30 tablet, Rfl: 11 .  donepezil (ARICEPT) 10 MG tablet, TAKE 1 TABLET AT BEDTIME, Disp: 90 tablet, Rfl: 1 .  fenofibrate (TRICOR) 145 MG tablet, TAKE 1 TABLET EVERY DAY, Disp: 90 tablet, Rfl: 1 .  ibuprofen (ADVIL,MOTRIN) 200 MG tablet, Take 200 mg by mouth every 6 (six) hours as needed for mild pain. , Disp: , Rfl:  .  levETIRAcetam (KEPPRA) 500 MG tablet, Take 500 mg by mouth 2 (two) times daily., Disp: , Rfl:  .  lisinopril (PRINIVIL,ZESTRIL) 5 MG tablet, Take 1 tablet (5 mg total) by mouth daily., Disp: 90 tablet, Rfl: 3 .  meloxicam (MOBIC) 15 MG tablet, TAKE 1 TABLET EVERY DAY, Disp: 90 tablet, Rfl: 1 .  NAMENDA XR 28  MG CP24 24 hr capsule, TAKE 1 CAPSULE EVERY DAY, Disp: 90 capsule, Rfl: 1 .  omeprazole (PRILOSEC) 20 MG capsule, TAKE 1 CAPSULE EVERY DAY, Disp: 90 capsule, Rfl: 1  No Known Allergies   Review of Systems  Constitutional: Negative for chills and fever.  Cardiovascular: Negative for chest pain and palpitations.    Objective  Vitals:   07/31/16 1039  BP: 128/76  Pulse: 68  Resp: 18  Temp: 98.3 F (36.8 C)  TempSrc: Oral  SpO2: 98%    Physical Exam  Musculoskeletal:       Hands: Gangrene over the distal tip of right middle finger, no erythema, mild tenderness to palpation over the affected area, normal Radial pulses, restricted ROM of finger.  Nursing note and vitals reviewed.   Recent Results (from the past 2160 hour(s))  Urine Microalbumin w/creat. ratio     Status: None   Collection Time: 05/04/16 12:00 AM  Result Value Ref Range   Creatinine, Urine 196.8 Not Estab. mg/dL   Microalbum.,U,Random 21.5 Not Estab. ug/mL   MICROALB/CREAT RATIO 10.9 0.0 - 30.0 mg/g creat  POCT HgB A1C     Status: None   Collection Time: 05/04/16 10:50 AM  Result Value Ref Range   Hemoglobin A1C 5.9   BUN     Status: Abnormal   Collection Time: 07/14/16  5:55 PM  Result Value Ref Range   BUN 28 (H) 6 - 20 mg/dL  Creatinine, serum     Status: Abnormal   Collection Time: 07/14/16  5:55 PM  Result Value Ref Range   Creatinine, Ser 1.17 (H) 0.44 - 1.00 mg/dL   GFR calc non Af Amer 44 (L) >60 mL/min   GFR calc Af Amer 51 (L) >60 mL/min    Comment: (NOTE) The eGFR has been calculated using the CKD EPI equation. This calculation has not been validated in all clinical situations. eGFR's persistently <60 mL/min signify possible Chronic Kidney Disease.   Glucose, capillary     Status: None   Collection Time: 07/17/16  7:26 AM  Result Value Ref Range   Glucose-Capillary 73 65 - 99 mg/dL     Assessment & Plan  1. Gangrene of finger (HCC) Necrosis of the distal tip of right middle  finger, patient status post angiography of right upper extremity, will follow-up with vascular surgery. Patient will be referred to wound care for further treatment options. - AMB referral to wound care center  2. Essential hypertension BP stable and controlled on present therapy  3. Hyperlipidemia Goal LDL is less than 70 given both diabetes and CVD. Obtain FLP and adjust statin as necessary - Lipid Profile - COMPLETE METABOLIC PANEL WITH GFR   Megan Gates Asad A. Charna Archer  [facility] 07/31/2016 11:11 AM

## 2016-08-01 LAB — COMPREHENSIVE METABOLIC PANEL
A/G RATIO: 1 — AB (ref 1.2–2.2)
ALK PHOS: 76 IU/L (ref 39–117)
ALT: 18 IU/L (ref 0–32)
AST: 44 IU/L — AB (ref 0–40)
Albumin: 3.7 g/dL (ref 3.5–4.8)
BILIRUBIN TOTAL: 0.4 mg/dL (ref 0.0–1.2)
BUN/Creatinine Ratio: 23 (ref 12–28)
BUN: 18 mg/dL (ref 8–27)
CALCIUM: 9.4 mg/dL (ref 8.7–10.3)
CO2: 23 mmol/L (ref 18–29)
Chloride: 103 mmol/L (ref 96–106)
Creatinine, Ser: 0.78 mg/dL (ref 0.57–1.00)
GFR calc Af Amer: 85 mL/min/{1.73_m2} (ref >59)
GFR, EST NON AFRICAN AMERICAN: 74 mL/min/{1.73_m2} (ref >59)
GLOBULIN, TOTAL: 3.6 g/dL (ref 1.5–4.5)
Glucose: 77 mg/dL (ref 65–99)
POTASSIUM: 4 mmol/L (ref 3.5–5.2)
SODIUM: 140 mmol/L (ref 134–144)
Total Protein: 7.3 g/dL (ref 6.0–8.5)

## 2016-08-01 LAB — LIPID PANEL W/O CHOL/HDL RATIO
Cholesterol, Total: 110 mg/dL (ref 100–199)
HDL: 40 mg/dL (ref >39)
LDL Calculated: 46 mg/dL (ref 0–99)
TRIGLYCERIDES: 120 mg/dL (ref 0–149)
VLDL Cholesterol Cal: 24 mg/dL (ref 5–40)

## 2016-08-04 ENCOUNTER — Ambulatory Visit: Payer: Medicare PPO | Admitting: Surgery

## 2016-08-11 ENCOUNTER — Emergency Department: Payer: Medicare PPO

## 2016-08-11 ENCOUNTER — Encounter: Payer: Self-pay | Admitting: Emergency Medicine

## 2016-08-11 ENCOUNTER — Inpatient Hospital Stay
Admission: EM | Admit: 2016-08-11 | Discharge: 2016-08-16 | DRG: 064 | Disposition: A | Discharge status: Hospice | Payer: Medicare PPO | Attending: Internal Medicine | Admitting: Internal Medicine

## 2016-08-11 DIAGNOSIS — R627 Adult failure to thrive: Secondary | ICD-10-CM

## 2016-08-11 DIAGNOSIS — N390 Urinary tract infection, site not specified: Secondary | ICD-10-CM

## 2016-08-11 DIAGNOSIS — N3000 Acute cystitis without hematuria: Secondary | ICD-10-CM | POA: Diagnosis present

## 2016-08-11 DIAGNOSIS — F0150 Vascular dementia without behavioral disturbance: Secondary | ICD-10-CM | POA: Diagnosis present

## 2016-08-11 DIAGNOSIS — Z515 Encounter for palliative care: Secondary | ICD-10-CM

## 2016-08-11 DIAGNOSIS — R1310 Dysphagia, unspecified: Secondary | ICD-10-CM

## 2016-08-11 DIAGNOSIS — A047 Enterocolitis due to Clostridium difficile: Secondary | ICD-10-CM | POA: Diagnosis present

## 2016-08-11 DIAGNOSIS — E785 Hyperlipidemia, unspecified: Secondary | ICD-10-CM | POA: Diagnosis present

## 2016-08-11 DIAGNOSIS — I10 Essential (primary) hypertension: Secondary | ICD-10-CM

## 2016-08-11 DIAGNOSIS — Z95828 Presence of other vascular implants and grafts: Secondary | ICD-10-CM

## 2016-08-11 DIAGNOSIS — I6340 Cerebral infarction due to embolism of unspecified cerebral artery: Principal | ICD-10-CM | POA: Diagnosis present

## 2016-08-11 DIAGNOSIS — B9620 Unspecified Escherichia coli [E. coli] as the cause of diseases classified elsewhere: Secondary | ICD-10-CM | POA: Diagnosis present

## 2016-08-11 DIAGNOSIS — I160 Hypertensive urgency: Secondary | ICD-10-CM | POA: Diagnosis present

## 2016-08-11 DIAGNOSIS — R4182 Altered mental status, unspecified: Secondary | ICD-10-CM

## 2016-08-11 DIAGNOSIS — Z79899 Other long term (current) drug therapy: Secondary | ICD-10-CM

## 2016-08-11 DIAGNOSIS — Z7982 Long term (current) use of aspirin: Secondary | ICD-10-CM

## 2016-08-11 DIAGNOSIS — R4189 Other symptoms and signs involving cognitive functions and awareness: Secondary | ICD-10-CM

## 2016-08-11 DIAGNOSIS — Z791 Long term (current) use of non-steroidal anti-inflammatories (NSAID): Secondary | ICD-10-CM

## 2016-08-11 DIAGNOSIS — G9340 Encephalopathy, unspecified: Secondary | ICD-10-CM | POA: Diagnosis present

## 2016-08-11 DIAGNOSIS — A0472 Enterocolitis due to Clostridium difficile, not specified as recurrent: Secondary | ICD-10-CM

## 2016-08-11 DIAGNOSIS — I639 Cerebral infarction, unspecified: Secondary | ICD-10-CM

## 2016-08-11 DIAGNOSIS — Z87891 Personal history of nicotine dependence: Secondary | ICD-10-CM

## 2016-08-11 DIAGNOSIS — E1151 Type 2 diabetes mellitus with diabetic peripheral angiopathy without gangrene: Secondary | ICD-10-CM | POA: Diagnosis present

## 2016-08-11 DIAGNOSIS — Z7902 Long term (current) use of antithrombotics/antiplatelets: Secondary | ICD-10-CM

## 2016-08-11 DIAGNOSIS — G9349 Other encephalopathy: Secondary | ICD-10-CM | POA: Diagnosis present

## 2016-08-11 DIAGNOSIS — E860 Dehydration: Secondary | ICD-10-CM | POA: Diagnosis present

## 2016-08-11 DIAGNOSIS — Z809 Family history of malignant neoplasm, unspecified: Secondary | ICD-10-CM

## 2016-08-11 DIAGNOSIS — K219 Gastro-esophageal reflux disease without esophagitis: Secondary | ICD-10-CM | POA: Diagnosis present

## 2016-08-11 DIAGNOSIS — Z66 Do not resuscitate: Secondary | ICD-10-CM

## 2016-08-11 DIAGNOSIS — E11649 Type 2 diabetes mellitus with hypoglycemia without coma: Secondary | ICD-10-CM

## 2016-08-11 LAB — URINALYSIS COMPLETE WITH MICROSCOPIC (ARMC ONLY)
BILIRUBIN URINE: NEGATIVE
Glucose, UA: NEGATIVE mg/dL
KETONES UR: NEGATIVE mg/dL
NITRITE: POSITIVE — AB
PH: 5 (ref 5.0–8.0)
PROTEIN: 100 mg/dL — AB
SPECIFIC GRAVITY, URINE: 1.014 (ref 1.005–1.030)
Squamous Epithelial / LPF: NONE SEEN

## 2016-08-11 LAB — COMPREHENSIVE METABOLIC PANEL
ALBUMIN: 3.3 g/dL — AB (ref 3.5–5.0)
ALT: 37 U/L (ref 14–54)
ANION GAP: 10 (ref 5–15)
AST: 76 U/L — AB (ref 15–41)
Alkaline Phosphatase: 65 U/L (ref 38–126)
BUN: 26 mg/dL — AB (ref 6–20)
CHLORIDE: 107 mmol/L (ref 101–111)
CO2: 21 mmol/L — ABNORMAL LOW (ref 22–32)
Calcium: 9.5 mg/dL (ref 8.9–10.3)
Creatinine, Ser: 0.94 mg/dL (ref 0.44–1.00)
GFR calc Af Amer: >60 mL/min (ref >60)
GFR, EST NON AFRICAN AMERICAN: 57 mL/min — AB (ref >60)
Glucose, Bld: 89 mg/dL (ref 65–99)
POTASSIUM: 3.7 mmol/L (ref 3.5–5.1)
Sodium: 138 mmol/L (ref 135–145)
Total Bilirubin: 0.4 mg/dL (ref 0.3–1.2)
Total Protein: 7.4 g/dL (ref 6.5–8.1)

## 2016-08-11 LAB — CBC
HEMATOCRIT: 35.9 % (ref 35.0–47.0)
HEMOGLOBIN: 12.3 g/dL (ref 12.0–16.0)
MCH: 27.9 pg (ref 26.0–34.0)
MCHC: 34.2 g/dL (ref 32.0–36.0)
MCV: 81.4 fL (ref 80.0–100.0)
Platelets: 197 10*3/uL (ref 150–440)
RBC: 4.42 MIL/uL (ref 3.80–5.20)
RDW: 15.5 % — AB (ref 11.5–14.5)
WBC: 6.8 10*3/uL (ref 3.6–11.0)

## 2016-08-11 LAB — GLUCOSE, CAPILLARY
Glucose-Capillary: 78 mg/dL (ref 65–99)
Glucose-Capillary: 71 mg/dL (ref 65–99)
Glucose-Capillary: 80 mg/dL (ref 65–99)

## 2016-08-11 LAB — TROPONIN I: Troponin I: <0.03 ng/mL (ref <0.03)

## 2016-08-11 LAB — MRSA PCR SCREENING: MRSA BY PCR: NEGATIVE

## 2016-08-11 MED ORDER — SODIUM CHLORIDE 0.9 % IV BOLUS (SEPSIS)
1000.0000 mL | Freq: Once | INTRAVENOUS | Status: AC
  Administered 2016-08-11: 1000 mL via INTRAVENOUS

## 2016-08-11 MED ORDER — ACETAMINOPHEN 325 MG PO TABS: 650.0000 mg | ORAL_TABLET | Freq: Four times a day (QID) | ORAL | Status: DC | PRN

## 2016-08-11 MED ORDER — MEMANTINE HCL ER 28 MG PO CP24
28.0000 mg | ORAL_CAPSULE | Freq: Every day | ORAL | Status: DC
  Filled 2016-08-11: qty 1

## 2016-08-11 MED ORDER — MODAFINIL 100 MG PO TABS: 200.0000 mg | ORAL_TABLET | Freq: Every day | ORAL | Status: DC

## 2016-08-11 MED ORDER — HYDRALAZINE HCL 20 MG/ML IJ SOLN
10.0000 mg | INTRAMUSCULAR | Status: DC | PRN
  Administered 2016-08-11 – 2016-08-14 (×3): 10 mg via INTRAVENOUS
  Filled 2016-08-11 (×5): qty 1

## 2016-08-11 MED ORDER — SODIUM CHLORIDE 0.9 % IV SOLN
500.0000 mg | Freq: Two times a day (BID) | INTRAVENOUS | Status: DC
  Administered 2016-08-12 – 2016-08-16 (×10): 500 mg via INTRAVENOUS
  Filled 2016-08-11 (×11): qty 5

## 2016-08-11 MED ORDER — DEXTROSE 5 % IV SOLN
1.0000 g | Freq: Once | INTRAVENOUS | Status: AC
  Administered 2016-08-11: 1 g via INTRAVENOUS
  Filled 2016-08-11: qty 10

## 2016-08-11 MED ORDER — ACETAMINOPHEN 650 MG RE SUPP: 650.0000 mg | Freq: Four times a day (QID) | RECTAL | Status: DC | PRN

## 2016-08-11 MED ORDER — LISINOPRIL 10 MG PO TABS: 5.0000 mg | ORAL_TABLET | Freq: Every day | ORAL | Status: DC

## 2016-08-11 MED ORDER — SODIUM CHLORIDE 0.9 % IV SOLN: INTRAVENOUS | Status: DC

## 2016-08-11 MED ORDER — ARMODAFINIL 150 MG PO TABS: 150.0000 mg | ORAL_TABLET | Freq: Every day | ORAL | Status: DC

## 2016-08-11 MED ORDER — ORAL CARE MOUTH RINSE
15.0000 mL | Freq: Two times a day (BID) | OROMUCOSAL | Status: DC
  Administered 2016-08-12 – 2016-08-16 (×5): 15 mL via OROMUCOSAL

## 2016-08-11 MED ORDER — LEVETIRACETAM 500 MG PO TABS: 500.0000 mg | ORAL_TABLET | Freq: Two times a day (BID) | ORAL | Status: DC

## 2016-08-11 MED ORDER — ASPIRIN EC 81 MG PO TBEC: 81.0000 mg | DELAYED_RELEASE_TABLET | Freq: Every day | ORAL | Status: DC

## 2016-08-11 MED ORDER — ENOXAPARIN SODIUM 40 MG/0.4ML ~~LOC~~ SOLN
40.0000 mg | SUBCUTANEOUS | Status: DC
  Administered 2016-08-11 – 2016-08-15 (×5): 40 mg via SUBCUTANEOUS
  Filled 2016-08-11 (×5): qty 0.4

## 2016-08-11 MED ORDER — OXYCODONE HCL 5 MG PO TABS: 5.0000 mg | ORAL_TABLET | ORAL | Status: DC | PRN

## 2016-08-11 MED ORDER — MEMANTINE HCL ER 7 MG PO CP24
28.0000 mg | ORAL_CAPSULE | Freq: Every day | ORAL | Status: DC
  Filled 2016-08-11 (×2): qty 4

## 2016-08-11 MED ORDER — FENOFIBRATE 54 MG PO TABS: 54.0000 mg | ORAL_TABLET | Freq: Every day | ORAL | Status: DC

## 2016-08-11 MED ORDER — CLOPIDOGREL BISULFATE 75 MG PO TABS: 75.0000 mg | ORAL_TABLET | Freq: Every day | ORAL | Status: DC

## 2016-08-11 MED ORDER — ONDANSETRON HCL 4 MG/2ML IJ SOLN: 4.0000 mg | Freq: Four times a day (QID) | INTRAMUSCULAR | Status: DC | PRN

## 2016-08-11 MED ORDER — ONDANSETRON HCL 4 MG PO TABS: 4.0000 mg | ORAL_TABLET | Freq: Four times a day (QID) | ORAL | Status: DC | PRN

## 2016-08-11 MED ORDER — DEXTROSE 5 % IV SOLN
1.0000 g | INTRAVENOUS | Status: DC
  Administered 2016-08-12 – 2016-08-15 (×4): 1 g via INTRAVENOUS
  Filled 2016-08-11 (×4): qty 10

## 2016-08-11 MED ORDER — ATORVASTATIN CALCIUM 20 MG PO TABS: 40.0000 mg | ORAL_TABLET | Freq: Every day | ORAL | Status: DC

## 2016-08-11 MED ORDER — PANTOPRAZOLE SODIUM 40 MG PO TBEC: 40.0000 mg | DELAYED_RELEASE_TABLET | Freq: Every day | ORAL | Status: DC

## 2016-08-11 MED ORDER — CITALOPRAM HYDROBROMIDE 20 MG PO TABS: 20.0000 mg | ORAL_TABLET | Freq: Every day | ORAL | Status: DC

## 2016-08-11 MED ORDER — DONEPEZIL HCL 5 MG PO TABS: 10.0000 mg | ORAL_TABLET | Freq: Every day | ORAL | Status: DC

## 2016-08-11 NOTE — H&P (Signed)
[employer] [facility] [place] [facility]   PATIENT NAME: Megan Gates    MR#:  [id number]  DATE OF BIRTH:  Dec 11, 1941   DATE OF ADMISSION:  08/11/2016  PRIMARY CARE PHYSICIAN: Dennison Mascot, MD   REQUESTING/REFERRING PHYSICIAN: Derrill Kay  CHIEF COMPLAINT:   Chief Complaint  Patient presents with  . Altered Mental Status    HISTORY OF PRESENT ILLNESS:  Megan Gates  is a 75 y.o. female with a known history of Vascular dementia with recent stroke discharged from [facility] about a week ago presenting a nursing facility; with altered mental status. Patient unable to provide any information given mental status. She was found to be near unresponsive/nonverbal thus brought to the Hospital further workup and evaluation. Once again she is unable to provide meaningful information given mental status routine workup revealed urinary tract infection  PAST MEDICAL HISTORY:   Past Medical History:  Diagnosis Date  . Depression   . Diabetes mellitus without complication (HCC)   . Hyperlipemia   . Hypertension   . Peripheral vascular disease (HCC)   . Seizures (HCC)     PAST SURGICAL HISTORY:   Past Surgical History:  Procedure Laterality Date  . ABDOMINAL HYSTERECTOMY    . EYE SURGERY    . PERIPHERAL VASCULAR CATHETERIZATION Right 12/22/2015   Procedure: Upper Extremity Angiography;  Surgeon: Annice Needy, MD;  Location: [facility] INVASIVE CV LAB;  Service: Cardiovascular;  Laterality: Right;  . PERIPHERAL VASCULAR CATHETERIZATION  12/22/2015   Procedure: Upper Extremity Intervention;  Surgeon: Annice Needy, MD;  Location: [facility] INVASIVE CV LAB;  Service: Cardiovascular;;  . PERIPHERAL VASCULAR CATHETERIZATION Right 05/01/2016   Procedure: Upper Extremity Angiography;  Surgeon: Annice Needy, MD;  Location: [facility] INVASIVE CV LAB;  Service: Cardiovascular;  Laterality: Right;  . PERIPHERAL VASCULAR CATHETERIZATION  05/01/2016   Procedure: Upper Extremity Intervention;   Surgeon: Annice Needy, MD;  Location: [facility] INVASIVE CV LAB;  Service: Cardiovascular;;  . PERIPHERAL VASCULAR CATHETERIZATION Right 07/17/2016   Procedure: Upper Extremity Angiography;  Surgeon: Annice Needy, MD;  Location: [facility] INVASIVE CV LAB;  Service: Cardiovascular;  Laterality: Right;  . PERIPHERAL VASCULAR CATHETERIZATION  07/17/2016   Procedure: Upper Extremity Intervention;  Surgeon: Annice Needy, MD;  Location: [facility] INVASIVE CV LAB;  Service: Cardiovascular;;  . SHOULDER SURGERY Left     SOCIAL HISTORY:   Social History  Substance Use Topics  . Smoking status: Former Smoker    Packs/day: 0.50    Years: 62.00    Types: Cigarettes  . Smokeless tobacco: Never Used  . Alcohol use No    FAMILY HISTORY:   Family History  Problem Relation Age of Onset  . Cancer Mother     DRUG ALLERGIES:  No Known Allergies  REVIEW OF SYSTEMS:  Unable to obtain given patient's mental status   MEDICATIONS AT HOME:   Prior to Admission medications   Medication Sig Start Date End Date Taking? Authorizing Provider  Armodafinil (NUVIGIL) 150 MG tablet Take 150 mg by mouth daily.    Historical Provider, MD  aspirin 81 MG EC tablet Take 81 mg by mouth daily.     Historical Provider, MD  atorvastatin (LIPITOR) 40 MG tablet Take 40 mg by mouth daily.    Historical Provider, MD  citalopram (CELEXA) 20 MG tablet TAKE 1 TABLET EVERY DAY 02/15/16   Dennison Mascot, MD  clopidogrel (PLAVIX) 75 MG tablet Take 1 tablet (75 mg total) by mouth daily. 07/17/16  Annice NeedyJason S Dew, MD  donepezil (ARICEPT) 10 MG tablet TAKE 1 TABLET AT BEDTIME 02/15/16   Dennison MascotLemont Morrisey, MD  fenofibrate (TRICOR) 145 MG tablet TAKE 1 TABLET EVERY DAY 02/15/16   Dennison MascotLemont Morrisey, MD  ibuprofen (ADVIL,MOTRIN) 200 MG tablet Take 200 mg by mouth every 6 (six) hours as needed for mild pain.     Historical Provider, MD  levETIRAcetam (KEPPRA) 500 MG tablet Take 500 mg by mouth 2 (two) times daily. 07/11/16   Historical Provider, MD  lisinopril  (PRINIVIL,ZESTRIL) 5 MG tablet Take 1 tablet (5 mg total) by mouth daily. 07/13/16   Ellyn HackSyed Asad A Shah, MD  meloxicam (MOBIC) 15 MG tablet TAKE 1 TABLET EVERY DAY 02/15/16   Dennison MascotLemont Morrisey, MD  NAMENDA XR 28 MG CP24 24 hr capsule TAKE 1 CAPSULE EVERY DAY 02/15/16   Dennison MascotLemont Morrisey, MD  omeprazole (PRILOSEC) 20 MG capsule TAKE 1 CAPSULE EVERY DAY 02/15/16   Dennison MascotLemont Morrisey, MD      VITAL SIGNS:  Blood pressure (!) 182/72, pulse 85, temperature 97.9 F (36.6 C), temperature source Axillary, resp. rate 18, SpO2 95 %.  PHYSICAL EXAMINATION:   VITAL SIGNS: Vitals:   08/11/16 1251  BP: (!) 182/72  Pulse: 85  Resp: 18  Temp: 97.9 F (36.6 C)   GENERAL:75 y.o.female moderate distress given mental status.  HEAD: Normocephalic, atraumatic.  EYES: Pupils equal, round, reactive to light. Unable to assess extraocular muscles given mental status/medical condition. No scleral icterus.  MOUTH: Dry mucosal membrane. Dentition intact. No abscess noted.  EAR, NOSE, THROAT: Clear without exudates. No external lesions.  NECK: Supple. No thyromegaly. No nodules. No JVD.  PULMONARY: Clear to ascultation, without wheeze rails or rhonci. No use of accessory muscles, Good respiratory effort. good air entry bilaterally CHEST: Nontender to palpation.  CARDIOVASCULAR: S1 and S2. Regular rate and rhythm. No murmurs, rubs, or gallops. No edema. Pedal pulses 2+ bilaterally.  GASTROINTESTINAL: Soft, nontender, nondistended. No masses. Positive bowel sounds. No hepatosplenomegaly.  MUSCULOSKELETAL: No swelling, clubbing, or edema. Range of motion full in all extremities.  NEUROLOGIC: Unable to assess given mental status/medical condition SKIN: No ulceration, lesions, rashes, or cyanosis. Skin warm and dry. Turgor intact.  PSYCHIATRIC: Awake but unable to follow simple commands, oriented 0 Unable to fully assess given mental status/medical condition     LABORATORY PANEL:   CBC  Recent Labs Lab 08/11/16 1245    WBC 6.8  HGB 12.3  HCT 35.9  PLT 197   ------------------------------------------------------------------------------------------------------------------  Chemistries   Recent Labs Lab 08/11/16 1245  NA 138  K 3.7  CL 107  CO2 21*  GLUCOSE 89  BUN 26*  CREATININE 0.94  CALCIUM 9.5  AST 76*  ALT 37  ALKPHOS 65  BILITOT 0.4   ------------------------------------------------------------------------------------------------------------------  Cardiac Enzymes  Recent Labs Lab 08/11/16 1245  TROPONINI <0.03   ------------------------------------------------------------------------------------------------------------------  RADIOLOGY:  Dg Chest 2 View  Result Date: 08/11/2016 CLINICAL DATA:  Recent stroke.  Nonverbal. EXAM: CHEST  2 VIEW COMPARISON:  08/21/2006. FINDINGS: Enlarged cardiomediastinal silhouette. Edema throughout both lung fields suggesting early CHF. No pneumothorax or consolidation. Bones unremarkable. Thoracic atherosclerosis. IMPRESSION: Cardiomegaly. Early CHF not excluded. Worsening aeration from priors. Electronically Signed   By: Elsie StainJohn T Curnes M.D.   On: 08/11/2016 14:21   Ct Head Wo Contrast  Result Date: 08/11/2016 CLINICAL DATA:  Altered mental state.  History of seizures. EXAM: CT HEAD WITHOUT CONTRAST TECHNIQUE: Contiguous axial images were obtained from the base of the skull through the vertex  without intravenous contrast. COMPARISON:  06/24/2016 MRI brain and 08/21/2006 head CT. FINDINGS: Brain: Motion degraded scan. No evidence of parenchymal hemorrhage or extra-axial fluid collection. No mass lesion, mass effect, or midline shift. No CT evidence of acute infarction. Small left thalamic lacune. Intracranial atherosclerosis. Nonspecific moderate subcortical and periventricular white matter hypodensity, most in keeping with chronic small vessel ischemic change. Cerebral volume is age appropriate. No ventriculomegaly. Vascular: No hyperdense vessel or  unexpected calcification. Skull: No evidence of calvarial fracture. Sinuses/Orbits: The visualized paranasal sinuses are essentially clear. Left lens surgery. Other:  The mastoid air cells are unopacified. IMPRESSION: 1. Motion degraded scan. No evidence of acute intracranial abnormality. 2. Moderate chronic small vessel ischemia and small left thalamic lacune. Electronically Signed   By: Delbert Phenix M.D.   On: 08/11/2016 13:49    EKG:   Orders placed or performed during the hospital encounter of 08/11/16  . ED EKG within 10 minutes  . ED EKG within 10 minutes  . EKG 12-Lead  . EKG 12-Lead    IMPRESSION AND PLAN:   75 year old African-American female history of vascular dementia with recent stroke presenting with altered mental status  1. Acute encephalopathy, metabolic: Secondary to urinary tract infection site unspecified: Continue ceftriaxone, obtain urine culture adjust antibiotics accordingly, neuro checks every 4 hours 2. Essential hypertension: Poorly controlled, restart home medications in addition to as needed IV hydralazine 3. Hyperlipidemia unspecified: Statin therapy 4. Baseline vascular dementia continue Aricept and Namenda 5. GERD without esophagitis: PPI therapy    All the records are reviewed and case discussed with ED provider. Management plans discussed with the patient, family and they are in agreement.  CODE STATUS: Full  TOTAL TIME TAKING CARE OF THIS PATIENT: 33 minutes.    Ewa Hipp,  Mardi Mainland.D on 08/11/2016 at 4:03 PM  Between 7am to 6pm - Pager - [phone]  After 6pm: House Pager: - [phone]  [facility] [place] [facility]  Office  [phone]  CC: Primary care physician; Dennison Mascot, MD

## 2016-08-11 NOTE — ED Provider Notes (Signed)
[facility] Emergency Department Provider Note    ____________________________________________   I have reviewed the triage vital signs and the nursing notes.   HISTORY  Chief Complaint Altered Mental Status   History limited by altered mental status. History obtained from son    HPI Megan Gates is a 75 y.o. female who presents to the emergency department today from [facility] because of altered mental status. The patient recently was admitted to [facility] after a hospitalization at [facility] for stroke.In discussion with the family it sounds like the patient had been doing slightly worse the day of discharge at [facility]. [facility] she was altered and became nonverbal. They had not noticed any fevers. They did think they noticed a slight cough.   Past Medical History:  Diagnosis Date  . Depression   . Diabetes mellitus without complication (HCC)   . Hyperlipemia   . Hypertension   . Peripheral vascular disease (HCC)   . Seizures [facility])     Patient Active Problem List   Diagnosis Date Noted  . 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 Ischemic stroke of frontal lobe (HCC) 06/26/2016  . Stroke-like symptoms 05/30/2016  . Heart murmur 05/30/2016  . 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 Hypertension 05/04/2016  . Hyperlipidemia 05/19/2015  . 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 Obesity 05/19/2015  . Dementia 05/19/2015  . Elevated BP 03/03/2015    Past Surgical History:  Procedure Laterality Date  . ABDOMINAL HYSTERECTOMY    . EYE SURGERY    . PERIPHERAL VASCULAR CATHETERIZATION Right 12/22/2015   Procedure: Upper Extremity Angiography;  Surgeon: Annice Needy, MD;  Location: [facility] INVASIVE CV LAB;  Service: Cardiovascular;  Laterality: Right;  . PERIPHERAL VASCULAR CATHETERIZATION  12/22/2015   Procedure: Upper Extremity Intervention;  Surgeon: Annice Needy, MD;  Location: [facility] INVASIVE CV LAB;   Service: Cardiovascular;;  . PERIPHERAL VASCULAR CATHETERIZATION Right 05/01/2016   Procedure: Upper Extremity Angiography;  Surgeon: Annice Needy, MD;  Location: [facility] INVASIVE CV LAB;  Service: Cardiovascular;  Laterality: Right;  . PERIPHERAL VASCULAR CATHETERIZATION  05/01/2016   Procedure: Upper Extremity Intervention;  Surgeon: Annice Needy, MD;  Location: [facility] INVASIVE CV LAB;  Service: Cardiovascular;;  . PERIPHERAL VASCULAR CATHETERIZATION Right 07/17/2016   Procedure: Upper Extremity Angiography;  Surgeon: Annice Needy, MD;  Location: [facility] INVASIVE CV LAB;  Service: Cardiovascular;  Laterality: Right;  . PERIPHERAL VASCULAR CATHETERIZATION  07/17/2016   Procedure: Upper Extremity Intervention;  Surgeon: Annice Needy, MD;  Location: [facility] INVASIVE CV LAB;  Service: Cardiovascular;;  . SHOULDER SURGERY Left     Prior to Admission medications   Medication Sig Start Date End Date Taking? Authorizing Provider  Armodafinil (NUVIGIL) 150 MG tablet Take 150 mg by mouth daily.    Historical Provider, MD  aspirin 81 MG EC tablet Take 81 mg by mouth daily.     Historical Provider, MD  atorvastatin (LIPITOR) 40 MG tablet Take 40 mg by mouth daily.    Historical Provider, MD  citalopram (CELEXA) 20 MG tablet TAKE 1 TABLET EVERY DAY 02/15/16   Dennison Mascot, MD  clopidogrel (PLAVIX) 75 MG tablet Take 1 tablet (75 mg total) by mouth daily. 07/17/16   Annice Needy, MD  donepezil (ARICEPT) 10 MG tablet TAKE 1 TABLET AT BEDTIME 02/15/16   Dennison Mascot, MD  fenofibrate (TRICOR) 145 MG tablet TAKE 1 TABLET EVERY DAY 02/15/16   Dennison Mascot, MD  ibuprofen (ADVIL,MOTRIN)  200 MG tablet Take 200 mg by mouth every 6 (six) hours as needed for mild pain.     Historical Provider, MD  levETIRAcetam (KEPPRA) 500 MG tablet Take 500 mg by mouth 2 (two) times daily. 07/11/16   Historical Provider, MD  lisinopril (PRINIVIL,ZESTRIL) 5 MG tablet Take 1 tablet (5 mg total) by mouth daily. 07/13/16   Ellyn HackSyed Asad A Shah, MD  meloxicam  (MOBIC) 15 MG tablet TAKE 1 TABLET EVERY DAY 02/15/16   Dennison MascotLemont Morrisey, MD  NAMENDA XR 28 MG CP24 24 hr capsule TAKE 1 CAPSULE EVERY DAY 02/15/16   Dennison MascotLemont Morrisey, MD  omeprazole (PRILOSEC) 20 MG capsule TAKE 1 CAPSULE EVERY DAY 02/15/16   Dennison MascotLemont Morrisey, MD    Allergies Review of patient's allergies indicates no known allergies.  Family History  Problem Relation Age of Onset  . Cancer Mother     Social History Social History  Substance Use Topics  . Smoking status: Former Smoker    Packs/day: 0.50    Years: 62.00    Types: Cigarettes  . Smokeless tobacco: Never Used  . Alcohol use No    Review of Systems Unable to obtain psych the patient being nonverbal  ____________________________________________   PHYSICAL EXAM:  VITAL SIGNS: ED Triage Vitals [08/11/16 1251]  Enc Vitals Group     BP (!) 182/72     Pulse Rate 85     Resp 18     Temp 97.9 F (36.6 C)     Temp Source Axillary     SpO2 95 %   Constitutional: Awake and alert. No distress. Non verbal.  Eyes: Conjunctivae are normal. Normal extraocular movements. ENT   Head: Normocephalic and atraumatic.   Nose: No congestion/rhinnorhea.   Mouth/Throat: Mucous membranes are moist.   Neck: No stridor. Hematological/Lymphatic/Immunilogical: No cervical lymphadenopathy. Cardiovascular: Normal rate, regular rhythm.  No murmurs, rubs, or gallops. Respiratory: Normal respiratory effort without tachypnea nor retractions. Breath sounds are clear and equal bilaterally. No wheezes/rales/rhonchi. Gastrointestinal: Soft and nontender. Genitourinary: Deferred Musculoskeletal: Normal range of motion in all extremities. No lower extremity edema. Neurologic:  Awake and alert. Non verbal. Appears to move all extremities.  Skin:  Skin is warm, dry and intact. No rash noted.  ____________________________________________    LABS (pertinent positives/negatives)  Labs Reviewed  COMPREHENSIVE METABOLIC PANEL -  Abnormal; Notable for the following:       Result Value   CO2 21 (*)    BUN 26 (*)    Albumin 3.3 (*)    AST 76 (*)    GFR calc non Af Amer 57 (*)    All other components within normal limits  CBC - Abnormal; Notable for the following:    RDW 15.5 (*)    All other components within normal limits  URINALYSIS COMPLETEWITH MICROSCOPIC ([facility] ONLY) - Abnormal; Notable for the following:    Color, Urine YELLOW (*)    APPearance CLOUDY (*)    Hgb urine dipstick 2+ (*)    Protein, ur 100 (*)    Nitrite POSITIVE (*)    Leukocytes, UA 2+ (*)    Bacteria, UA MANY (*)    All other components within normal limits  TROPONIN I  CBC  CREATININE, SERUM  CBG MONITORING, ED     ____________________________________________   EKG  I, Phineas SemenGraydon Caitlynne Harbeck, attending physician, personally viewed and interpreted this EKG  EKG Time: 1335 Rate: 77 Rhythm: normal sinus rhythm Axis: normal Intervals: qtc 461 QRS: narrow, q waves V1 ST  changes: no st elevation Impression: abnormal ekg ____________________________________________    RADIOLOGY  CT head   IMPRESSION:  1. Motion degraded scan. No evidence of acute intracranial  abnormality.  2. Moderate chronic small vessel ischemia and small left thalamic  lacune.     ____________________________________________   PROCEDURES  Procedures  ____________________________________________   INITIAL IMPRESSION / ASSESSMENT AND PLAN / ED COURSE  Pertinent labs & imaging results that were available during my care of the patient were reviewed by me and considered in my medical decision making (see chart for details).  Patient presented to the emergency department today because of concern for altered mental status after a recent stroke. The patient had been recently admitted to [facility]. Family states she has been becoming more confused and nonverbal. Patient's head CT today without any concerning findings. Patient's  urine however is concerning for UTI. This could explain the patient's change in mental status. Will plan on giving IV fluids, antibiotics and admission. ____________________________________________   FINAL CLINICAL IMPRESSION(S) / ED DIAGNOSES  Final diagnoses:  Altered mental status, unspecified altered mental status type  UTI (lower urinary tract infection)     Note: This dictation was prepared with Dragon dictation. Any transcriptional errors that result from this process are unintentional    Phineas Semen, MD 08/11/16 1558

## 2016-08-11 NOTE — ED Notes (Signed)
Family at bedside. 

## 2016-08-11 NOTE — Progress Notes (Signed)
Pharmacy Antibiotic Note  Megan NewcomerBetty Lou Gates is a 75 y.o. female admitted on 08/11/2016 with UTI.  Pharmacy has been consulted for ceftriaxone dosing.  Plan: Patient received ceftriaxone 1g x 1 in ED for UTI. Patient will be continued on ceftriaxone 1 g q 24 beginning tomorrow at 1600.  Weight: 219 lb 8 oz (99.6 kg)  Temp (24hrs), Avg:97.9 F (36.6 C), Min:97.9 F (36.6 C), Max:97.9 F (36.6 C)   Recent Labs Lab 08/11/16 1245  WBC 6.8  CREATININE 0.94    Estimated Creatinine Clearance: 58.4 mL/min (by C-G formula based on SCr of 0.94 mg/dL).    No Known Allergies  Antimicrobials this admission: Ceftriaxone 9/1 >>   Microbiology results: 9/1 UCx: Sent  9/1 MRSA PCR: Negative  Thank you for allowing pharmacy to be a part of this patient's care.  Megan LatinoHolly Correne Gates, PharmD Pharmacy Resident 08/11/2016 8:14 PM

## 2016-08-11 NOTE — ED Triage Notes (Signed)
Pt to ED via EMS from [facility], was d/c from [facility] after stroke last week. Pt was found this morning to be nonverbal, unable to follow commands. Hx of focal seizures per EMS. Per EMS VS 185/91, 95%RA, 90.

## 2016-08-11 NOTE — ED Notes (Signed)
Patient transported to X-ray 

## 2016-08-11 NOTE — Progress Notes (Signed)
Pt admitted from the ED prior to shift change. No signs of pain. Pt Responds to voice. Poor concentration. Follow simple commands at times. Hydralazine given for SBP>180. Night RN is aware.

## 2016-08-12 ENCOUNTER — Inpatient Hospital Stay: Payer: Medicare PPO

## 2016-08-12 DIAGNOSIS — G9340 Encephalopathy, unspecified: Secondary | ICD-10-CM | POA: Diagnosis not present

## 2016-08-12 DIAGNOSIS — N3000 Acute cystitis without hematuria: Secondary | ICD-10-CM | POA: Diagnosis present

## 2016-08-12 DIAGNOSIS — Z515 Encounter for palliative care: Secondary | ICD-10-CM | POA: Diagnosis present

## 2016-08-12 DIAGNOSIS — F0150 Vascular dementia without behavioral disturbance: Secondary | ICD-10-CM | POA: Diagnosis present

## 2016-08-12 DIAGNOSIS — Z809 Family history of malignant neoplasm, unspecified: Secondary | ICD-10-CM | POA: Diagnosis not present

## 2016-08-12 DIAGNOSIS — Z66 Do not resuscitate: Secondary | ICD-10-CM | POA: Diagnosis not present

## 2016-08-12 DIAGNOSIS — R627 Adult failure to thrive: Secondary | ICD-10-CM | POA: Diagnosis present

## 2016-08-12 DIAGNOSIS — A047 Enterocolitis due to Clostridium difficile: Secondary | ICD-10-CM | POA: Diagnosis present

## 2016-08-12 DIAGNOSIS — G9349 Other encephalopathy: Secondary | ICD-10-CM | POA: Diagnosis present

## 2016-08-12 DIAGNOSIS — E785 Hyperlipidemia, unspecified: Secondary | ICD-10-CM | POA: Diagnosis present

## 2016-08-12 DIAGNOSIS — R4182 Altered mental status, unspecified: Secondary | ICD-10-CM | POA: Diagnosis present

## 2016-08-12 DIAGNOSIS — E11649 Type 2 diabetes mellitus with hypoglycemia without coma: Secondary | ICD-10-CM | POA: Diagnosis present

## 2016-08-12 DIAGNOSIS — R1310 Dysphagia, unspecified: Secondary | ICD-10-CM | POA: Diagnosis not present

## 2016-08-12 DIAGNOSIS — K219 Gastro-esophageal reflux disease without esophagitis: Secondary | ICD-10-CM | POA: Diagnosis present

## 2016-08-12 DIAGNOSIS — Z87891 Personal history of nicotine dependence: Secondary | ICD-10-CM | POA: Diagnosis not present

## 2016-08-12 DIAGNOSIS — Z79899 Other long term (current) drug therapy: Secondary | ICD-10-CM | POA: Diagnosis not present

## 2016-08-12 DIAGNOSIS — Z789 Other specified health status: Secondary | ICD-10-CM | POA: Diagnosis not present

## 2016-08-12 DIAGNOSIS — I160 Hypertensive urgency: Secondary | ICD-10-CM | POA: Diagnosis present

## 2016-08-12 DIAGNOSIS — R404 Transient alteration of awareness: Secondary | ICD-10-CM | POA: Diagnosis not present

## 2016-08-12 DIAGNOSIS — I6340 Cerebral infarction due to embolism of unspecified cerebral artery: Secondary | ICD-10-CM | POA: Diagnosis present

## 2016-08-12 DIAGNOSIS — E1151 Type 2 diabetes mellitus with diabetic peripheral angiopathy without gangrene: Secondary | ICD-10-CM | POA: Diagnosis present

## 2016-08-12 DIAGNOSIS — B9620 Unspecified Escherichia coli [E. coli] as the cause of diseases classified elsewhere: Secondary | ICD-10-CM | POA: Diagnosis present

## 2016-08-12 DIAGNOSIS — Z7902 Long term (current) use of antithrombotics/antiplatelets: Secondary | ICD-10-CM | POA: Diagnosis not present

## 2016-08-12 DIAGNOSIS — Z7982 Long term (current) use of aspirin: Secondary | ICD-10-CM | POA: Diagnosis not present

## 2016-08-12 DIAGNOSIS — E860 Dehydration: Secondary | ICD-10-CM | POA: Diagnosis present

## 2016-08-12 DIAGNOSIS — Z791 Long term (current) use of non-steroidal anti-inflammatories (NSAID): Secondary | ICD-10-CM | POA: Diagnosis not present

## 2016-08-12 LAB — GLUCOSE, CAPILLARY
GLUCOSE-CAPILLARY: 70 mg/dL (ref 65–99)
GLUCOSE-CAPILLARY: 77 mg/dL (ref 65–99)
GLUCOSE-CAPILLARY: 79 mg/dL (ref 65–99)
GLUCOSE-CAPILLARY: 85 mg/dL (ref 65–99)
Glucose-Capillary: 64 mg/dL — ABNORMAL LOW (ref 65–99)
Glucose-Capillary: 90 mg/dL (ref 65–99)

## 2016-08-12 LAB — BASIC METABOLIC PANEL
Anion gap: 7 (ref 5–15)
BUN: 27 mg/dL — AB (ref 6–20)
CHLORIDE: 111 mmol/L (ref 101–111)
CO2: 22 mmol/L (ref 22–32)
CREATININE: 0.91 mg/dL (ref 0.44–1.00)
Calcium: 9.1 mg/dL (ref 8.9–10.3)
GFR, EST NON AFRICAN AMERICAN: 60 mL/min — AB (ref >60)
Glucose, Bld: 77 mg/dL (ref 65–99)
POTASSIUM: 3.7 mmol/L (ref 3.5–5.1)
SODIUM: 140 mmol/L (ref 135–145)

## 2016-08-12 LAB — CBC
HEMATOCRIT: 36.1 % (ref 35.0–47.0)
Hemoglobin: 12.4 g/dL (ref 12.0–16.0)
MCH: 27.8 pg (ref 26.0–34.0)
MCHC: 34.2 g/dL (ref 32.0–36.0)
MCV: 81.4 fL (ref 80.0–100.0)
PLATELETS: 189 10*3/uL (ref 150–440)
RBC: 4.44 MIL/uL (ref 3.80–5.20)
RDW: 15.6 % — AB (ref 11.5–14.5)
WBC: 5.9 10*3/uL (ref 3.6–11.0)

## 2016-08-12 MED ORDER — METOPROLOL TARTRATE 25 MG PO TABS: 25.0000 mg | ORAL_TABLET | Freq: Two times a day (BID) | ORAL | Status: DC

## 2016-08-12 MED ORDER — METOPROLOL TARTRATE 5 MG/5ML IV SOLN
5.0000 mg | Freq: Four times a day (QID) | INTRAVENOUS | Status: DC
  Administered 2016-08-12 – 2016-08-16 (×14): 5 mg via INTRAVENOUS
  Filled 2016-08-12 (×15): qty 5

## 2016-08-12 MED ORDER — KCL IN DEXTROSE-NACL 20-5-0.9 MEQ/L-%-% IV SOLN
INTRAVENOUS | Status: DC
  Administered 2016-08-12 – 2016-08-16 (×7): via INTRAVENOUS
  Filled 2016-08-12 (×11): qty 1000

## 2016-08-12 MED ORDER — NYSTATIN 100000 UNIT/ML MT SUSP
5.0000 mL | Freq: Two times a day (BID) | OROMUCOSAL | Status: DC
Start: 2016-08-12 — End: 2016-08-16
  Administered 2016-08-12 – 2016-08-16 (×8): 500000 [IU] via ORAL
  Filled 2016-08-12 (×8): qty 5

## 2016-08-12 MED ORDER — NYSTATIN 100000 UNIT/ML MT SUSP: 5.0000 mL | Freq: Four times a day (QID) | OROMUCOSAL | Status: DC

## 2016-08-12 MED ORDER — INSULIN ASPART 100 UNIT/ML ~~LOC~~ SOLN
0.0000 [IU] | Freq: Three times a day (TID) | SUBCUTANEOUS | Status: DC
  Administered 2016-08-14 – 2016-08-16 (×3): 1 [IU] via SUBCUTANEOUS
  Filled 2016-08-12 (×3): qty 1

## 2016-08-12 MED ORDER — ASPIRIN 300 MG RE SUPP
300.0000 mg | Freq: Every day | RECTAL | Status: DC
  Administered 2016-08-12 – 2016-08-15 (×4): 300 mg via RECTAL
  Filled 2016-08-12 (×5): qty 1

## 2016-08-12 MED ORDER — INSULIN ASPART 100 UNIT/ML ~~LOC~~ SOLN: 0.0000 [IU] | Freq: Every day | SUBCUTANEOUS | Status: DC

## 2016-08-12 MED ORDER — SODIUM CHLORIDE 0.9 % IV SOLN
INTRAVENOUS | Status: DC
  Administered 2016-08-12: 09:00:00 via INTRAVENOUS

## 2016-08-12 NOTE — Evaluation (Addendum)
Clinical/Bedside Swallow Evaluation Patient Details  Name: Megan Gates MRN: [id number] Date of Birth: [birth year]  Today's Date: 08/12/2016 Time: SLP Start Time (ACUTE ONLY): 0830 SLP Stop Time (ACUTE ONLY): 0930 SLP Time Calculation (min) (ACUTE ONLY): 60 min  Past Medical History:  Past Medical History:  Diagnosis Date  . Depression   . Diabetes mellitus without complication (HCC)   . Hyperlipemia   . Hypertension   . Peripheral vascular disease (HCC)   . Seizures (HCC)    Past Surgical History:  Past Surgical History:  Procedure Laterality Date  . ABDOMINAL HYSTERECTOMY    . EYE SURGERY    . PERIPHERAL VASCULAR CATHETERIZATION Right 12/22/2015   Procedure: Upper Extremity Angiography;  Surgeon: Annice NeedyJason S Dew, MD;  Location: [facility] INVASIVE CV [facility];  Service: Cardiovascular;  Laterality: Right;  . PERIPHERAL VASCULAR CATHETERIZATION  12/22/2015   Procedure: Upper Extremity Intervention;  Surgeon: Annice NeedyJason S Dew, MD;  Location: [facility] INV[facility]IVE CV [facility];  Service: Cardiovascular;;  . PERIPHERAL VASCULAR CATHETERIZATION Right 05/01/2016   Procedure: Upper Extremity Angiography;  Surgeon: Annice NeedyJason S Dew, MD;  Location: [facility] CV [facility];  Service: Cardiovascular;  Laterality: Right;  . PERIPHERAL VASCULAR CATHETERIZATION  05/01/2016   Procedure: Upper Extremity Intervention;  Surgeon: Annice NeedyJason S Dew, MD;  Location: [facility] CV [facility];  Service: Cardiovascular;;  . PERIPHERAL VASCULAR CATHETERIZATION Right 07/17/2016   Procedure: Upper Extremity Angiography;  Surgeon: Annice NeedyJason S Dew, MD;  Location: [facility] CV [facility];  Service: Cardiovascular;  Laterality: Right;  . PERIPHERAL VASCULAR CATHETERIZATION  07/17/2016   Procedure: Upper Extremity Intervention;  Surgeon: Annice NeedyJason S Dew, MD;  Location: [facility]IVE CV [facility];  Service: Cardiovascular;;  . SHOULDER SURGERY Left    HPI:  Pt is a 75 y.o. female with a known history of Vascular dementia, PVD, HTN, seizures and with recent stroke [facility] presenting from a nursing facility; with altered mental status. Patient unable to provide any information given mental status. She was found to be near unresponsive/nonverbal thus brought to the Hospital further workup and evaluation. Once again she is unable to provide meaningful information given mental status routine workup revealed urinary tract infection. Pt currently more nonverbal but gave one slight nod of head x1 to direct questions. Pt followed commands x1 of several.    Assessment / Plan / Recommendation Clinical Impression  Pt appears to present w/ increased risk for aspiration w/ severe s/s of aspiration noted. When pt was presented w/ tsp trials of ice chips and Nectar consistency liquids, she exhibited both oral and pharyngeal phase dysphagia including immediate coughing after lengthy oral phase time/holding upon attempting to swallow the bolus trial. Lingual protrusion was noted as pt attempted f/u swallowing of the bolus residue during the coughing. Pt exhibited s/s velopharyngeal incompetency. Pt is nonverbal and does not follow commands adequately. Due to pt's risk for aspiration, recommend full NPO status including medications. MD consulted and agreed. NSG to provide frequent oral care for stimulation and hygiene. ST services will f/u w/ ongoing assessment.     Aspiration Risk  Severe aspiration risk    Diet Recommendation  NPO status w/ frequent oral care daily while NPO for hygiene and oral stimulation. Aspiration precautions.  Medication Administration: Via alternative means (IV)    Other  Recommendations Recommended Consults:  (palliative care consult TBD) Oral Care Recommendations: Oral care QID;Staff/trained caregiver to provide oral care (while NPO)  F/u w/ Neurology as indicated; possible need for f/u  MRI if neuro presentation is different from dc/ from [facility] ~1 week ago.  Follow up Recommendations  Skilled Nursing facility (TBD)     Frequency and Duration min 3x week  2 weeks       Prognosis Prognosis for Safe Diet Advancement: Guarded Barriers to Reach Goals: Cognitive deficits;Severity of deficits      Swallow Study   General Date of Onset: 08/11/16 HPI: Pt is a 75 y.o. female with a known history of Vascular dementia, PVD, HTN, seizures and with recent stroke discharged from [facility] about a week ago presenting from a nursing facility; with altered mental status. Patient unable to provide any information given mental status. She was found to be near unresponsive/nonverbal thus brought to the Hospital further workup and evaluation. Once again she is unable to provide meaningful information given mental status routine workup revealed urinary tract infection. Pt currently more nonverbal but gave one slight nod of head x1 to direct questions. Pt followed commands x1 of several.  Type of Study: Bedside Swallow Evaluation Previous Swallow Assessment: unknown Diet Prior to this Study:  (unknown) Temperature Spikes Noted: No (wbc not elevated) Respiratory Status: Room air History of Recent Intubation: No Behavior/Cognition: Confused;Lethargic/Drowsy;Doesn't follow directions Oral Cavity Assessment: Dried secretions (sticky) Oral Care Completed by SLP: Yes (attempted) Oral Cavity - Dentition: Edentulous Vision:  (n/a) Self-Feeding Abilities: Total assist Patient Positioning: Upright in bed Baseline Vocal Quality:  (nonverbal) Volitional Cough: Cognitively unable to elicit Volitional Swallow: Unable to elicit    Oral/Motor/Sensory Function Overall Oral Motor/Sensory Function:  (unable to participate and follow through d/t Cognition)   Ice Chips Ice chips: Impaired Presentation: Spoon (fed; x3 trials) Oral Phase Impairments: Reduced labial seal;Reduced lingual movement/coordination;Poor awareness of bolus (loss of bolus) Oral Phase Functional Implications: Left anterior spillage;Prolonged oral  transit;Oral holding (min leakage; loss of bolus) Pharyngeal Phase Impairments: Suspected delayed Swallow   Thin Liquid Thin Liquid: Not tested    Nectar Thick Nectar Thick Liquid: Impaired Presentation: Spoon (fed; 2 trials) Oral Phase Impairments: Reduced labial seal;Reduced lingual movement/coordination;Poor awareness of bolus Oral phase functional implications: Left anterior spillage;Prolonged oral transit;Oral holding Pharyngeal Phase Impairments: Cough - Immediate;Suspected delayed Swallow (post swallow) Other Comments: noted lingual protrusion as pt gave f/u swallows in attempts to manage the bolus residue during coughing   Honey Thick Honey Thick Liquid: Not tested   Puree Puree: Not tested   Solid   GO   Solid: Not tested    Functional Assessment Tool Used: clinical judgement Functional Limitations: Swallowing Swallow Current Status (U1324): 100 percent impaired, limited or restricted Swallow Goal Status (M0102): At least 60 percent but less than 80 percent impaired, limited or restricted Swallow Discharge Status [phone]): At least 60 percent but less than 80 percent impaired, limited or restricted    Jerilynn Som, MS, CCC-SLP  Watson,Katherine 08/12/2016,9:47 AM

## 2016-08-12 NOTE — Clinical Social Work Note (Signed)
Clinical Social Work Assessment  Patient Details  Name: Megan NewcomerBetty Lou Gates MRN: [id number] Date of Birth: 1941-03-07  Date of referral:  08/12/16               Reason for consult:  Facility Placement                Permission sought to share information with:  Facility Industrial/product designerContact Representative Permission granted to share information::  Yes, Release of Information Signed  Name::        Agency::  [facility]  Relationship::     Contact Information:     Housing/Transportation Living arrangements for the past 2 months:  Skilled Nursing Facility Source of Information:  Facility Patient Interpreter Needed:  None Criminal Activity/Legal Involvement Pertinent to Current Situation/Hospitalization:  No - Comment as needed Significant Relationships:  Siblings Lives with:  Facility Resident Do you feel safe going back to the place where you live?  Yes Need for family participation in patient care:  Yes (Comment)  Care giving concerns:  Patient admitted from facility: [facility].   Social Worker assessment / plan:  Patient is non-verbal at this time due to CVA. Information gleaned from facility representative, medical chart, and interdisciplinary discussion with SLP.  [facility]. Jomarie LongsJoseph indicated that if the patient becomes medically stable, she could return to [facility]. SLP suggested palliative care consult. PT not able to assess at this time. CSW will con't to follow.    [facility] been at [facility].  Employment status:  Retired Health and safety inspectornsurance information:  Medicare PT Recommendations:  Not assessed at this time Information / Referral to community resources:  Skilled Nursing Facility  Patient/Family's Response to care:  N/A  Patient/Family's Understanding of and Emotional Response to Diagnosis, Current Treatment, and Prognosis:  N/A  Emotional Assessment Appearance:  Appears  older than stated age Attitude/Demeanor/Rapport:   (Patient is non verbal at this time) Affect (typically observed):  Flat Orientation:   (Patient is non verbal at this time) Alcohol / Substance use:  Never Used Psych involvement (Current and /or in the community):  No (Comment)  Discharge Needs  Concerns to be addressed:  Discharge Planning Concerns Readmission within the last 30 days:  No Current discharge risk:  Chronically ill Barriers to Discharge:  Continued Medical Work up   [employer] CorporationKaren M Hiawatha Merriott, LCSW 08/12/2016, 11:01 AM

## 2016-08-12 NOTE — Evaluation (Signed)
Physical Therapy Evaluation Patient Details Name: Megan NewcomerBetty Lou Gates MRN: [id number] DOB: Feb 26, 1941 Today's Date: 08/12/2016   History of Present Illness  Pt recently discharged from [facility] post CVA, unsure as to her level of function but she is here now with UTI/AMS and is very limited regarding communication.    Clinical Impression  Pt is very limited with what she is able to do and though PT is unsure how much she had been doing it is not likely she will be able to do any standing in the near future.  She was able to do some light U&LE movement with AAROM but is very weak (appears to be grossly = bilaterally) and limited t/o.  She was unable to make any verbalizations but did appear to follow some instructions as she was capable.  Pt with very limited ability to maintain sitting balance, but did show good effort with a lot of cuing and set up.    Follow Up Recommendations SNF    Equipment Recommendations   (to be determined)    Recommendations for Other Services       Precautions / Restrictions Precautions Precautions: Fall Restrictions Weight Bearing Restrictions: No      Mobility  Bed Mobility Overal bed mobility: Needs Assistance Bed Mobility: Supine to Sit;Sit to Supine     Supine to sit: Max assist Sit to supine: Max assist   General bed mobility comments: Pt initially needing heavy assist to even maintain static sitting but with cuing and a lot of positioning/set up she was able to "maintain" balance 3-5 seconds on a few occasions but overall did not show core strength or ability to maintain sitting.  Transfers                 General transfer comment: unable, unsafe to attempt  Ambulation/Gait                Stairs            Wheelchair Mobility    Modified Rankin (Stroke Patients Only)       Balance Overall balance assessment: Needs assistance   Sitting balance-Leahy Scale: Poor Sitting balance - Comments: Pt only briefly able to  maintain sitting EOB w/o direct (and generally heavy) assist                                     Pertinent Vitals/Pain Pain Assessment:  (Pt does not seem to indicate pain with most acts)    Home Living Family/patient expects to be discharged to:: Skilled nursing facility                      Prior Function           Comments: unsure, presuming she was quite limited.  Pt unable to report.     Hand Dominance        Extremity/Trunk Assessment   Upper Extremity Assessment: Generalized weakness (only 3-/5 AAROM at elbows, no shoulder mvt, minimal grip)           Lower Extremity Assessment: Generalized weakness (very weak t/o, AAROM only with limited control grossly = b/l)         Communication   Communication: Expressive difficulties (non-verbal - did appear to understand most of what was asked)  Cognition Arousal/Alertness: Lethargic Behavior During Therapy: WFL for tasks assessed/performed Overall Cognitive Status: Difficult to assess  General Comments      Exercises        Assessment/Plan    PT Assessment Patient needs continued PT services  PT Diagnosis Generalized weakness;Altered mental status   PT Problem List Decreased strength;Decreased activity tolerance;Decreased range of motion;Decreased mobility;Decreased balance;Decreased coordination;Decreased cognition;Decreased knowledge of use of DME;Decreased safety awareness;Decreased knowledge of precautions  PT Treatment Interventions Functional mobility training;Therapeutic activities;Therapeutic exercise;Balance training;Neuromuscular re-education;Cognitive remediation;Patient/family education   PT Goals (Current goals can be found in the Care Plan section) Acute Rehab PT Goals Patient Stated Goal: unable to state PT Goal Formulation: Patient unable to participate in goal setting Time For Goal Achievement: 08/26/16 Potential to Achieve Goals:  Poor    Frequency Min 2X/week   Barriers to discharge        Co-evaluation               End of Session   Activity Tolerance: Patient limited by fatigue (mental status)        Functional Assessment Tool Used: clinical judgement Functional Limitation: Changing and maintaining body position Changing and Maintaining Body Position Current Status (Z6109): At least 80 percent but less than 100 percent impaired, limited or restricted Changing and Maintaining Body Position Goal Status (U0454): At least 40 percent but less than 60 percent impaired, limited or restricted    Time: 0935-0950 PT Time Calculation (min) (ACUTE ONLY): 15 min   Charges:   PT Evaluation $PT Eval Low Complexity: 1 Procedure     PT G Codes:   PT G-Codes **NOT FOR INPATIENT CLASS** Functional Assessment Tool Used: clinical judgement Functional Limitation: Changing and maintaining body position Changing and Maintaining Body Position Current Status (U9811): At least 80 percent but less than 100 percent impaired, limited or restricted Changing and Maintaining Body Position Goal Status (B1478): At least 40 percent but less than 60 percent impaired, limited or restricted    Malachi Pro, DPT 08/12/2016, 11:41 AM

## 2016-08-12 NOTE — NC FL2 (Signed)
[place] MEDICAID FL2 LEVEL OF CARE SCREENING TOOL     IDENTIFICATION  Patient Name: Megan Gates Birthdate: 12-25-1940 Sex: female Admission Date (Current Location): 08/11/2016  [place]   Facility and Address:  [facility], [address], [place], [place] [address]      Provider Number: [id number]  Attending Physician Name and Address:  Shaune PollackQing Kyreese Chio, MD  Relative Name and Phone Number:       Current Level of Care: Hospital Recommended Level of Care: Skilled Nursing Facility Prior Approval Number:    Date Approved/Denied: 08/08/16 PASRR Number: [id number]  Discharge Plan: SNF    Current Diagnoses: Patient Active Problem List   Diagnosis Date Noted  . Acute encephalopathy 08/12/2016  . Encephalopathy acute 08/11/2016  . UTI (lower urinary tract infection) 08/11/2016  . 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 Ischemic stroke of frontal lobe (HCC) 06/26/2016  . Heart murmur 05/30/2016  . 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 Hypertension 05/04/2016  . Hyperlipidemia 05/19/2015  . 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 Obesity 05/19/2015  . Dementia 05/19/2015  . Elevated BP 03/03/2015    Orientation RESPIRATION BLADDER Height & Weight      (Patient is non verbal)  Normal Incontinent Weight: 219 lb 8 oz (99.6 kg) Height:     BEHAVIORAL SYMPTOMS/MOOD NEUROLOGICAL BOWEL NUTRITION STATUS      Incontinent    AMBULATORY STATUS COMMUNICATION OF NEEDS Skin   Total Care Does not communicate Normal                       Personal Care Assistance Level of Assistance  Bathing, Feeding, Dressing, Total care Bathing Assistance: Maximum assistance Feeding assistance: Maximum assistance Dressing Assistance: Maximum assistance Total Care Assistance: Maximum assistance   Functional Limitations Info  Speech     Speech Info: Impaired    SPECIAL CARE FACTORS FREQUENCY                        Contractures Contractures Info: Present    Additional Factors Info  Code Status Code Status Info: DNR             Current Medications (08/12/2016):  This is the current hospital active medication list Current Facility-Administered Medications  Medication Dose Route Frequency Provider Last Rate Last Dose  . acetaminophen (TYLENOL) tablet 650 mg  650 mg Oral Q6H PRN Wyatt Hasteavid K Hower, MD       Or  . acetaminophen (TYLENOL) suppository 650 mg  650 mg Rectal Q6H PRN Wyatt Hasteavid K Hower, MD      . aspirin suppository 300 mg  300 mg Rectal Daily Shaune PollackQing Keara Pagliarulo, MD   300 mg at 08/12/16 1124  . atorvastatin (LIPITOR) tablet 40 mg  40 mg Oral Daily Wyatt Hasteavid K Hower, MD      . cefTRIAXone (ROCEPHIN) 1 g in dextrose 5 % 50 mL IVPB  1 g Intravenous Q24H Rolm BaptiseHolly N Gilliam, RPH      . citalopram (CELEXA) tablet 20 mg  20 mg Oral Daily Wyatt Hasteavid K Hower, MD      . clopidogrel (PLAVIX) tablet 75 mg  75 mg Oral Daily Wyatt Hasteavid K Hower, MD      . dextrose 5 % and 0.9 % NaCl with KCl 20 mEq/L infusion   Intravenous Continuous Shaune PollackQing Tiarna Koppen, MD 75 mL/hr at 08/12/16 1117    . donepezil (ARICEPT) tablet 10 mg  10  mg Oral QHS Wyatt Haste, MD      . enoxaparin (LOVENOX) injection 40 mg  40 mg Subcutaneous Q24H Wyatt Haste, MD   40 mg at 08/11/16 2340  . fenofibrate tablet 54 mg  54 mg Oral Daily Wyatt Haste, MD      . hydrALAZINE (APRESOLINE) injection 10 mg  10 mg Intravenous Q4H PRN Wyatt Haste, MD   10 mg at 08/11/16 1900  . insulin aspart (novoLOG) injection 0-5 Units  0-5 Units Subcutaneous QHS Shaune Pollack, MD      . insulin aspart (novoLOG) injection 0-9 Units  0-9 Units Subcutaneous TID WC Shaune Pollack, MD      . levETIRAcetam (KEPPRA) 500 mg in sodium chloride 0.9 % 100 mL IVPB  500 mg Intravenous BID Alexis Hugelmeyer, DO   500 mg at 08/12/16 1120  . lisinopril (PRINIVIL,ZESTRIL) tablet 5 mg  5 mg Oral Daily Wyatt Haste, MD      . MEDLINE mouth rinse  15 mL Mouth Rinse BID Shaune Pollack, MD   15 mL at 08/12/16  1000  . memantine (NAMENDA XR) 24 hr capsule 28 mg  28 mg Oral Daily Shaune Pollack, MD      . metoprolol (LOPRESSOR) injection 5 mg  5 mg Intravenous Q6H Shaune Pollack, MD   5 mg at 08/12/16 1249  . modafinil (PROVIGIL) tablet 200 mg  200 mg Oral Daily Wyatt Haste, MD      . nystatin (MYCOSTATIN) 100000 UNIT/ML suspension 500,000 Units  5 mL Oral BID Shaune Pollack, MD   500,000 Units at 08/12/16 1248  . ondansetron (ZOFRAN) tablet 4 mg  4 mg Oral Q6H PRN Wyatt Haste, MD       Or  . ondansetron [facility]) injection 4 mg  4 mg Intravenous Q6H PRN Wyatt Haste, MD      . oxyCODONE (Oxy IR/ROXICODONE) immediate release tablet 5 mg  5 mg Oral Q4H PRN Wyatt Haste, MD      . pantoprazole (PROTONIX) EC tablet 40 mg  40 mg Oral Daily Wyatt Haste, MD         Discharge Medications: Please see discharge summary for a list of discharge medications.  Relevant Imaging Results:  Relevant Lab Results:   Additional Information  SS [id number]  Judi Cong, LCSW

## 2016-08-12 NOTE — Progress Notes (Addendum)
[employer] - [place] at [facility]   PATIENT NAME: Megan Gates    MR#:  [id number]  DATE OF BIRTH:  [birth year]  SUBJECTIVE:  CHIEF COMPLAINT:   Chief Complaint  Patient presents with  . Altered Mental Status   The patient is unresponsive and lethargic. REVIEW OF SYSTEMS:  Review of Systems  Unable to perform ROS: Mental status change    DRUG ALLERGIES:  No Known Allergies VITALS:  Blood pressure (!) 168/70, pulse 86, temperature 97.6 F (36.4 C), temperature source Oral, resp. rate 16, weight 219 lb 8 oz (99.6 kg), SpO2 100 %. PHYSICAL EXAMINATION:  Physical Exam  Constitutional:  Unresponsive and lethargic  HENT:  Head: Normocephalic.  Eyes: Conjunctivae and EOM are normal. Pupils are equal, round, and reactive to light. No scleral icterus.  Neck: No JVD present.  Cardiovascular: Normal rate and regular rhythm.  Exam reveals no gallop.   No murmur heard. Pulmonary/Chest: Effort normal and breath sounds normal. No respiratory distress. She has no wheezes. She has no rales.  Abdominal: Soft. Bowel sounds are normal. She exhibits no distension.  Musculoskeletal: She exhibits no edema.  Neurological:  unable to exam.  Skin: Skin is dry. No rash noted.  Psychiatric:  Unresponsive and lethargic   LABORATORY PANEL:   CBC  Recent Labs Lab 08/12/16 0453  WBC 5.9  HGB 12.4  HCT 36.1  PLT 189   ------------------------------------------------------------------------------------------------------------------ Chemistries   Recent Labs Lab 08/11/16 1245 08/12/16 0453  NA 138 140  K 3.7 3.7  CL 107 111  CO2 21* 22  GLUCOSE 89 77  BUN 26* 27*  CREATININE 0.94 0.91  CALCIUM 9.5 9.1  AST 76*  --   ALT 37  --   ALKPHOS 65  --   BILITOT 0.4  --    RADIOLOGY:  Dg Chest 2 View  Result Date: 08/11/2016 CLINICAL DATA:  Recent stroke.  Nonverbal. EXAM: CHEST  2 VIEW COMPARISON:  08/21/2006. FINDINGS: Enlarged cardiomediastinal silhouette.  Edema throughout both lung fields suggesting early CHF. No pneumothorax or consolidation. Bones unremarkable. Thoracic atherosclerosis. IMPRESSION: Cardiomegaly. Early CHF not excluded. Worsening aeration from priors. Electronically Signed   By: Elsie Stain M.D.   On: 08/11/2016 14:21   Ct Head Wo Contrast  Result Date: 08/11/2016 CLINICAL DATA:  Altered mental state.  History of seizures. EXAM: CT HEAD WITHOUT CONTRAST TECHNIQUE: Contiguous axial images were obtained from the base of the skull through the vertex without intravenous contrast. COMPARISON:  06/24/2016 MRI brain and 08/21/2006 head CT. FINDINGS: Brain: Motion degraded scan. No evidence of parenchymal hemorrhage or extra-axial fluid collection. No mass lesion, mass effect, or midline shift. No CT evidence of acute infarction. Small left thalamic lacune. Intracranial atherosclerosis. Nonspecific moderate subcortical and periventricular white matter hypodensity, most in keeping with chronic small vessel ischemic change. Cerebral volume is age appropriate. No ventriculomegaly. Vascular: No hyperdense vessel or unexpected calcification. Skull: No evidence of calvarial fracture. Sinuses/Orbits: The visualized paranasal sinuses are essentially clear. Left lens surgery. Other:  The mastoid air cells are unopacified. IMPRESSION: 1. Motion degraded scan. No evidence of acute intracranial abnormality. 2. Moderate chronic small vessel ischemia and small left thalamic lacune. Electronically Signed   By: Delbert Phenix M.D.   On: 08/11/2016 13:49   ASSESSMENT AND PLAN:   75 year old African-American female history of vascular dementia with recent stroke presenting with altered mental status  1. Acute metabolic encephalopathy Secondary to urinary tract infection  Continue ceftriaxone, follow-up blood and urine  culture. Keep nothing by mouth with IV fluid support per speech study.  * Diabetes with mild hypoglycemia. Since the patient is nothing by  mouth, I will start D5 normal saline with potassium. Start sliding scale.  2. Essential hypertension: Since the patient cannot take home medications due to nothing by mouth status, I will start Lopressor IV and as needed IV hydralazine.  3. Hyperlipidemia unspecified: Unable to get Statin therapy 4. Baseline vascular dementia, unable to take Aricept and Namenda 5. GERD without esophagitis: hold PPI.  Dehydration. Continue IV fluid support and follow-up BMP.  Recent CVA. Change aspirin to 300 mg per rectal.  Repeat MRI of the brain to rule out new stroke.  Discussed with speech study staff. The patient has very poor prognosis, she needs palliative care consult. All the records are reviewed and case discussed with Care Management/Social Worker. Management plans discussed with the patient's son and daughter-in-law for a long time and they are in agreement.  CODE STATUS: DO NOT RESUSCITATE  TOTAL TIME TAKING CARE OF THIS PATIENT: 48 minutes.   More than 50% of the time was spent in counseling/coordination of care: YES  POSSIBLE D/C IN 3 DAYS, DEPENDING ON CLINICAL CONDITION.   Shaune Pollackhen, Rickeya Manus M.D on 08/12/2016 at 11:49 AM  Between 7am to 6pm - Pager - [phone]  After 6pm go to [email or web address] - Social research officer, governmentpassword [email or web address]  [facility] [place] [facility]  Office  [phone]  CC: Primary care physician; Dennison MascotLemont Morrisey, MD  Note: This dictation was prepared with Dragon dictation along with smaller phrase technology. Any transcriptional errors that result from this process are unintentional.

## 2016-08-13 LAB — GLUCOSE, CAPILLARY
GLUCOSE-CAPILLARY: 74 mg/dL (ref 65–99)
GLUCOSE-CAPILLARY: 95 mg/dL (ref 65–99)
GLUCOSE-CAPILLARY: 61 mg/dL — AB (ref 65–99)
GLUCOSE-CAPILLARY: 70 mg/dL (ref 65–99)
Glucose-Capillary: 86 mg/dL (ref 65–99)

## 2016-08-13 MED ORDER — DEXTROSE 50 % IV SOLN
25.0000 mL | Freq: Once | INTRAVENOUS | Status: AC
  Administered 2016-08-13: 25 mL via INTRAVENOUS

## 2016-08-13 MED ORDER — DEXTROSE 50 % IV SOLN
INTRAVENOUS | Status: AC
  Administered 2016-08-13: 25 mL via INTRAVENOUS
  Filled 2016-08-13: qty 50

## 2016-08-13 NOTE — Progress Notes (Signed)
[facility] -  at [facility]   PATIENT NAME: Megan Gates    MR#:  [id number]  DATE OF BIRTH:  Jun 19, 1941  SUBJECTIVE:  CHIEF COMPLAINT:   Chief Complaint  Patient presents with  . Altered Mental Status   The patient is More awake, open her eyes upon stimuli,  and lethargic. REVIEW OF SYSTEMS:  Review of Systems  Unable to perform ROS: Mental status change    DRUG ALLERGIES:  No Known Allergies VITALS:  Blood pressure (!) 195/68, pulse 68, temperature 98.8 F (37.1 C), temperature source Oral, resp. rate 18, height 5\' 1"  (1.549 m), weight 220 lb 11.2 oz (100.1 kg), SpO2 96 %. PHYSICAL EXAMINATION:  Physical Exam  Constitutional:  Unresponsive and lethargic  HENT:  Head: Normocephalic.  Eyes: Conjunctivae and EOM are normal. Pupils are equal, round, and reactive to light. No scleral icterus.  Neck: No JVD present.  Cardiovascular: Normal rate and regular rhythm.  Exam reveals no gallop.   No murmur heard. Pulmonary/Chest: Effort normal and breath sounds normal. No respiratory distress. She has no wheezes. She has no rales.  Abdominal: Soft. Bowel sounds are normal. She exhibits no distension.  Musculoskeletal: She exhibits no edema.  Neurological:  unable to exam.  Skin: Skin is dry. No rash noted.  Psychiatric:  Unresponsive and lethargic   LABORATORY PANEL:   CBC  Recent Labs Lab 08/12/16 0453  WBC 5.9  HGB 12.4  HCT 36.1  PLT 189   ------------------------------------------------------------------------------------------------------------------ Chemistries   Recent Labs Lab 08/11/16 1245 08/12/16 0453  NA 138 140  K 3.7 3.7  CL 107 111  CO2 21* 22  GLUCOSE 89 77  BUN 26* 27*  CREATININE 0.94 0.91  CALCIUM 9.5 9.1  AST 76*  --   ALT 37  --   ALKPHOS 65  --   BILITOT 0.4  --    RADIOLOGY:  Mr Brain Wo Contrast  Result Date: 08/12/2016 CLINICAL DATA:  Unresponsive.  Recent stroke. EXAM: MRI HEAD WITHOUT CONTRAST  TECHNIQUE: Multiplanar, multiecho pulse sequences of the brain and surrounding structures were obtained without intravenous contrast. COMPARISON:  Head CT 08/11/2016 and MRI 06/24/2016 FINDINGS: Some sequences are mildly to moderately motion degraded despite using more motion resistant imaging protocols. There are multiple small foci of restricted diffusion consistent with acute infarcts. There is a 10 x 4 mm infarct in the left pons. Additional small infarcts are present in the right internal capsule, left lentiform nucleus, right genu of the corpus callosum, left body of the corpus callosum, subcortical right parietal lobe, and subcortical left frontal lobe. Chronic microhemorrhages are again seen in the brainstem, deep gray nuclei, and cerebral hemispheres, better demonstrated on the prior MRI due to differences in technique. There is no evidence of mass, midline shift, or extra-axial fluid collection. The ventricles and sulci are normal in size. Extensive T2 hyperintensities in the cerebral white matter and brainstem are unchanged from the prior MRI and compatible with chronic small vessel ischemic disease. Chronic lacunar infarcts are again seen in the pons, thalami, and basal ganglia as well as right centrum semiovale. Prior left cataract extraction is noted. There is mild mucosal thickening in the paranasal sinuses, and there is a moderate left mastoid effusion. Major intracranial vascular flow voids are preserved. IMPRESSION: 1. Multiple small acute supratentorial and infratentorial infarcts suggestive of emboli of a central source. 2. 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 Electronically Signed   By: Freida BusmanAllen  Mosetta Putt M.D.   On: 08/12/2016 19:22   ASSESSMENT AND PLAN:   75 year old African-American female history of vascular dementia with recent stroke presenting with altered mental status  1. Acute metabolic encephalopathy Secondary to urinary tract  infection and CVA. Continue ceftriaxone, follow-up blood and urine culture. Keep nothing by mouth with IV fluid support per speech study.  * Diabetes with mild hypoglycemia. Since the patient is nothing by mouth, continue D5 normal saline with potassium. on sliding scale.  2. Essential hypertension: Since the patient cannot take home medications due to nothing by mouth status, on Lopressor IV and as needed IV hydralazine.  3. Hyperlipidemia unspecified: Unable to get Statin therapy 4. Baseline vascular dementia, unable to take Aricept and Namenda 5. GERD without esophagitis: hold PPI.  Dehydration. Continue IV fluid support and follow-up BMP.  Acute CVA. Changed aspirin to 300 mg per rectal.  Repeat MRI of the brain showed acute multiple small infarcts.  The patient has very poor prognosis, f/u palliative care consult. All the records are reviewed and case discussed with Care Management/Social Worker. Management plans discussed with the patient's son and he is in agreement.  CODE STATUS: DO NOT RESUSCITATE  TOTAL TIME TAKING CARE OF THIS PATIENT: 38 minutes.   More than 50% of the time was spent in counseling/coordination of care: YES  POSSIBLE D/C IN 3 DAYS, DEPENDING ON CLINICAL CONDITION.   Shaune Pollack M.D on 08/13/2016 at 12:48 PM  Between 7am to 6pm - Pager - [phone]  After 6pm go to [email or web address] - Social research officer, government  [facility] [place] [facility]  Office  [phone]  CC: Primary care physician; Dennison Mascot, MD  Note: This dictation was prepared with Dragon dictation along with smaller phrase technology. Any transcriptional errors that result from this process are unintentional.

## 2016-08-13 NOTE — Plan of Care (Signed)
Problem: Education: Goal: Knowledge of [place] [facility] Education information/materials will improve Outcome: Not Progressing Dr Imogene Burnhen had RN remove Loop monitor [facility] (per notes to wear for 30 days); monitor placed on pt's counter, verbally advised son at the bedside; pt's son verbalized that he had a box at home that he is suppose to [facility] the [facility]; will take the monitor when he leaves today

## 2016-08-13 NOTE — Plan of Care (Signed)
Problem: Education: Goal: Knowledge of [place] [facility] Education information/materials will improve Outcome: Not Progressing Dr Imogene Burnhen spoke to pt and her son in the room regarding plan of care for pt this am; Palliative Care Consult pending for pt

## 2016-08-14 LAB — GLUCOSE, CAPILLARY
GLUCOSE-CAPILLARY: 72 mg/dL (ref 65–99)
GLUCOSE-CAPILLARY: 116 mg/dL — AB (ref 65–99)
GLUCOSE-CAPILLARY: 84 mg/dL (ref 65–99)
Glucose-Capillary: 124 mg/dL — ABNORMAL HIGH (ref 65–99)
Glucose-Capillary: 61 mg/dL — ABNORMAL LOW (ref 65–99)

## 2016-08-14 LAB — URINE CULTURE: Culture: >=100000 — AB

## 2016-08-14 MED ORDER — DEXTROSE 50 % IV SOLN
INTRAVENOUS | Status: AC
  Administered 2016-08-14: 21:00:00 50 mL
  Filled 2016-08-14: qty 50

## 2016-08-14 NOTE — Progress Notes (Signed)
Pharmacy Antibiotic Note  Megan Gates is a 75 y.o. female admitted on 08/11/2016 with UTI.  Pharmacy has been consulted for ceftriaxone dosing.  Plan: Continue Rocephin 1 g IV q24 hours.   Height: 5\' 1"  (154.9 cm) Weight: 220 lb 11.2 oz (100.1 kg) IBW/kg (Calculated) : 47.8  Temp (24hrs), Avg:98.3 F (36.8 C), Min:97.9 F (36.6 C), Max:98.6 F (37 C)   Recent Labs Lab 08/11/16 1245 08/12/16 0453  WBC 6.8 5.9  CREATININE 0.94 0.91    Estimated Creatinine Clearance: 57 mL/min (by C-G formula based on SCr of 0.91 mg/dL).    No Known Allergies  Antimicrobials this admission: Ceftriaxone 9/1 >>   Microbiology results: 9/1 UCx: Sent  9/1 MRSA PCR: Negative  Thank you for allowing pharmacy to be a part of this patient's care.  Demetrius Charityeldrin D. Bashar Milam, PharmD Clinical Pharmacist  08/14/2016 10:24 AM

## 2016-08-14 NOTE — Progress Notes (Signed)
CSW received phone call back form patient's son Megan JunkerKenneth Gates [phone]. Stated he's the best contact for patient. Reported that he'd like patient to return to [facility] when medically stable. Stated he's open to Palliative Care contacting him tomorrow on his cell phone to discuss goals of care for patient. CSW will continue to follow and assist.  Woodroe Modehristina Shelia Magallon, MSW, LCSW, LCAS-A Clinical Social Worker [phone]

## 2016-08-14 NOTE — Care Management Important Message (Signed)
Important Message  Patient Details  Name: Megan NewcomerBetty Lou Gates MRN: [id number] Date of Birth: 02-Jun-1941   Medicare Important Message Given:  Yes    [name], RN 08/14/2016, 10:00 AM

## 2016-08-14 NOTE — Progress Notes (Signed)
CSW attempted to speak to patient's son Mindi JunkerKenneth Summers [phone] to discuss discharge plans. Left voicemail. Awaiting phone call back.  Woodroe Modehristina Len Azeez, MSW, LCSW, LCAS-A Clinical Social Worker [phone]

## 2016-08-14 NOTE — Care Management (Signed)
Admitted to [facility] with the diagnosis of encephalopathy. Son is Mindi JunkerKenneth Summers [phone]). Primary care physician is Dr. Thana AtesMorrisey, Discharged [facility] to [facility] 08/10/16. Prior to being a patient at [facility] lived in the home and was independent of activities of daily living. Prescriptions are [facility] at [facility]. Followed by [facility] in the home. [facility] several times prior to going to [facility].  Gwenette GreetBrenda S Tyrihanna Wingert RN MSN CCM Care Management [phone]

## 2016-08-14 NOTE — Progress Notes (Signed)
CSW updated [facility] Admissions Coordinator on patient's status. CSW will continue to follow and assist.  Woodroe Modehristina Milen Lengacher, MSW, LCSW, LCAS-A Clinical Social Worker [phone]

## 2016-08-14 NOTE — Progress Notes (Signed)
[employer] - [place] at [facility]   PATIENT NAME: Megan Gates    MR#:  [id number]  DATE OF BIRTH:  [birth year]  SUBJECTIVE:  CHIEF COMPLAINT:   Chief Complaint  Patient presents with  . Altered Mental Status   The patient is More awake, open her eyes upon stimuli, follow limited commands but nonverbal. REVIEW OF SYSTEMS:  Review of Systems  Unable to perform ROS: Mental status change    DRUG ALLERGIES:  No Known Allergies VITALS:  Blood pressure (!) 195/69, pulse 66, temperature 97.9 F (36.6 C), temperature source Oral, resp. rate 20, height 5\' 1"  (1.549 m), weight 220 lb 11.2 oz (100.1 kg), SpO2 100 %. PHYSICAL EXAMINATION:  Physical Exam  Constitutional: No distress.  Opened eyes on simuli  HENT:  Head: Normocephalic.  Eyes: Conjunctivae and EOM are normal. Pupils are equal, round, and reactive to light. No scleral icterus.  Neck: No JVD present.  Cardiovascular: Normal rate and regular rhythm.  Exam reveals no gallop.   No murmur heard. Pulmonary/Chest: Effort normal and breath sounds normal. No respiratory distress. She has no wheezes. She has no rales.  Abdominal: Soft. Bowel sounds are normal. She exhibits no distension.  Musculoskeletal: She exhibits no edema.  Neurological: She is alert.  Follow limited commands.  Skin: Skin is dry. No rash noted.  Psychiatric:  confused   LABORATORY PANEL:   CBC  Recent Labs Lab 08/12/16 0453  WBC 5.9  HGB 12.4  HCT 36.1  PLT 189   ------------------------------------------------------------------------------------------------------------------ Chemistries   Recent Labs Lab 08/11/16 1245 08/12/16 0453  NA 138 140  K 3.7 3.7  CL 107 111  CO2 21* 22  GLUCOSE 89 77  BUN 26* 27*  CREATININE 0.94 0.91  CALCIUM 9.5 9.1  AST 76*  --   ALT 37  --   ALKPHOS 65  --   BILITOT 0.4  --    RADIOLOGY:  No results found. ASSESSMENT AND PLAN:   75 year old African-American female history of  vascular dementia with recent stroke presenting with altered mental status  1. Acute metabolic encephalopathy Secondary to urinary tract infection and CVA. Continue ceftriaxone, urine culture: E Coli. Keep nothing by mouth with IV fluid support per speech study reevaluation today.  * Diabetes with mild hypoglycemia. Since the patient is nothing by mouth, continue D5 normal saline with potassium. on sliding scale.  2. Essential hypertension urgency: not controled.   Since the patient cannot take home medications due to nothing by mouth status, on Lopressor IV and as needed IV hydralazine.  3. Hyperlipidemia unspecified: Unable to get Statin therapy 4. Baseline vascular dementia, unable to take Aricept and Namenda 5. GERD without esophagitis: hold PPI.  Dehydration. Continue IV fluid support and follow-up BMP.  Acute CVA. Changed aspirin to 300 mg per rectal.  Repeat MRI of the brain showed acute multiple small infarcts.  The patient has very poor prognosis, f/u palliative care consult. All the records are reviewed and case discussed with Care Management/Social Worker. Management plans discussed with the patient's son and he is in agreement.  CODE STATUS: DO NOT RESUSCITATE  TOTAL TIME TAKING CARE OF THIS PATIENT: 38 minutes.   More than 50% of the time was spent in counseling/coordination of care: YES  POSSIBLE D/C IN 2-3 DAYS, DEPENDING ON CLINICAL CONDITION.   Shaune Pollack M.D on 08/14/2016 at 12:09 PM  Between 7am to 6pm - Pager - [phone]  After 6pm go to [email or web address] - password [email or web address]  [employer] [place] [facility]  Office  [phone]  CC: Primary care physician; Dennison MascotLemont Morrisey, MD  Note: This dictation was prepared with Dragon dictation along with smaller phrase technology. Any transcriptional errors that result from this process are unintentional.

## 2016-08-14 NOTE — Progress Notes (Signed)
Speech Language Pathology Treatment: Dysphagia  Patient Details Name: Lovey NewcomerBetty Lou Husby MRN: [id number] DOB: 1941/05/23 Today's Date: 08/14/2016 Time: 1000-1100 SLP Time Calculation (min) (ACUTE ONLY): 60 min  Assessment / Plan / Recommendation Clinical Impression  Pt seen for reassessment for readiness for oral intake/po diet. Pt presented w/ increased alertness but appeared moderate drowsy. She required mod-max verbal/tactile cues to maintain attention during po bolus trial tasks.  During po trials, pt exhibited moderate-severe Oral phase dysphagia c/b increased oral phase time for bolus preparation and A-P transfer for swallowing - oral holding intermittently. Noted decreased lingual movements in general and labial leakage/spillage on Right side. Noted what appeared to be adequate oral clearing b/t trials. During the pharyngeal phase, suspect pt has a delayed pharyngeal swallow initiation. Coughing was noted x1/6 (3/4 full) tsps of Nectar consistency liquid trials. Suspect pt's oral phase deficits could be impacting the pharyngeal phase. Of significant note, pt exhibits moderate+ oral thrush coating her tongue and lips. Pt is on a mouth rinse and discussed w/ NSG ways to perform the oral rinse/care(wash cloth, Qtips). Such oral thrush can impact the oropharyngeal phases of swallowing.  Pt appears at a high risk for aspiration at this time. Recommend continue NPO status w/ alternative means for medications. Recommend frequent oral care for hygiene and oral stimulation. Pt may also attempt single ice chips w/ NSG supervision and w/ aspiration precautions to increase oral stimulation and swallowing as well. ST services will f/u tomorrow w/ ongoing assessment.     HPI HPI: Pt is a 75 y.o. female with a known history of Vascular dementia, PVD, HTN, seizures and with recent stroke [facility] about a week ago presenting from a nursing facility; with altered mental status.  Patient unable to provide any information given mental status. She was found to be near unresponsive/nonverbal thus brought to the Hospital further workup and evaluation. Once again she is unable to provide meaningful information given mental status routine workup revealed urinary tract infection. Pt currently more nonverbal but gave one slight nod of head x1 to direct questions. Pt continues to demonstrate difficutly following commands x1 of several. She required mod-max verbal/tactile cues to alert to tasks of taking po boluses.       SLP Plan  Continue with current plan of care     Recommendations  Diet recommendations: NPO (except ice chips trials w/ NSG) Medication Administration: Via alternative means (IV)             General recommendations:  (Dietician consult; possible Palliative Care consult) Oral Care Recommendations: Oral care QID;Staff/trained caregiver to provide oral care (use of lemon-glycerin swabs frequently daily) Follow up Recommendations: Skilled Nursing facility (TBD) Plan: Continue with current plan of care     GO               Jerilynn SomKatherine Siah Steely, MS, CCC-SLP  Tabb Croghan 08/14/2016, 12:13 PM

## 2016-08-15 ENCOUNTER — Inpatient Hospital Stay: Payer: Medicare PPO

## 2016-08-15 DIAGNOSIS — G9340 Encephalopathy, unspecified: Secondary | ICD-10-CM

## 2016-08-15 DIAGNOSIS — Z66 Do not resuscitate: Secondary | ICD-10-CM

## 2016-08-15 DIAGNOSIS — R1310 Dysphagia, unspecified: Secondary | ICD-10-CM

## 2016-08-15 DIAGNOSIS — Z789 Other specified health status: Secondary | ICD-10-CM

## 2016-08-15 DIAGNOSIS — Z515 Encounter for palliative care: Secondary | ICD-10-CM

## 2016-08-15 LAB — GLUCOSE, CAPILLARY
GLUCOSE-CAPILLARY: 124 mg/dL — AB (ref 65–99)
GLUCOSE-CAPILLARY: 81 mg/dL (ref 65–99)
Glucose-Capillary: 58 mg/dL — ABNORMAL LOW (ref 65–99)
Glucose-Capillary: 116 mg/dL — ABNORMAL HIGH (ref 65–99)
Glucose-Capillary: 135 mg/dL — ABNORMAL HIGH (ref 65–99)

## 2016-08-15 LAB — POCT CBG MONITORING: POCT GLUCOSE (MANUAL ENTRY) KUC: 81 mg/dL (ref 70–99)

## 2016-08-15 MED ORDER — NITROGLYCERIN 2 % TD OINT
0.5000 [in_us] | TOPICAL_OINTMENT | Freq: Four times a day (QID) | TRANSDERMAL | Status: DC
  Administered 2016-08-15 – 2016-08-16 (×5): 0.5 [in_us] via TOPICAL
  Filled 2016-08-15 (×5): qty 1

## 2016-08-15 NOTE — Progress Notes (Signed)
Speech Language Pathology Treatment: Dysphagia  Patient Details Name: Megan Gates MRN: [id number] DOB: 11-22-1941 Today's Date: 08/15/2016 Time: [id number] SLP Time Calculation (min) (ACUTE ONLY): 35 min  Assessment / Plan / Recommendation Clinical Impression  Pt presents w/ a declined presentation overall since tx session yesterday. Pt exhibits increased respiratory effort (heavy nasal breathing) vs yesterday. It was more difficult for pt to assist in holding her head and posture upright as SLP positioned pillows behind her for the head forward position(she was able to assist more yesterday). Upon tsp presentation of bolus materials(2 ice chips, 2 1/2 tsps of Nectar liquids), pt exhibited decreased oral awareness w/ only minimal oral response c/b lingual protrusion to the stimulation of the bolus material. No bolus trial was fully accepted; all leaked anteriorly(moreso on the Right side). No further trials were given. Oral care provided post tx session; reduced oral responses noted as well as apparent agitation and head turning away noted.  NSG consulted post session(present for some of it). SLP indicated concerns for a decline in Neurological status since eval and tx sessions previously. Recommend strict NPO status w/ oral care only. Signs adjusted in room. ST services will f/u w/ pt's status for appropriateness to re-attempt po trials next 1-3 days. NSG contacted MD to updated.     HPI HPI: Pt is a 75 y.o. female with a known history of Vascular dementia, PVD, HTN, seizures and with recent stroke [facility] a week ago presenting from a nursing facility; with altered mental status. Patient unable to provide any information given mental status. She was found to be near unresponsive/nonverbal thus brought to the Hospital further workup and evaluation. Once again she is unable to provide meaningful information given mental status routine workup revealed urinary tract  infection. Pt currently more nonverbal w/ reduced alertness to overall/oral stimulation. Noted increased, effortful respiratory effort; increased nasal breathing/effort.      SLP Plan  Continue with current plan of care     Recommendations  Diet recommendations: NPO Medication Administration: Via alternative means             General recommendations:  (Palliative Care consult) Oral Care Recommendations: Oral care QID;Staff/trained caregiver to provide oral care Follow up Recommendations: Skilled Nursing facility (TBD) Plan: Continue with current plan of care     GO               Jerilynn SomKatherine Watson, MS, CCC-SLP  Watson,Katherine 08/15/2016, 3:36 PM

## 2016-08-15 NOTE — Progress Notes (Signed)
[facility] - [place] at [facility]   PATIENT NAME: Megan Gates    MR#:  [id number]  DATE OF BIRTH:  1941/01/11  SUBJECTIVE:  CHIEF COMPLAINT:   Chief Complaint  Patient presents with  . Altered Mental Status   The patient Intermittently opens eyes upon stimuli, limit remains nonverbal, unable to follow commands, however, nods her head to questions REVIEW OF SYSTEMS:  Review of Systems  Unable to perform ROS: Mental status change    DRUG ALLERGIES:  No Known Allergies VITALS:  Blood pressure (!) 173/84, pulse (!) 101, temperature 97.8 F (36.6 C), temperature source Oral, resp. rate (!) 24, height 5\' 1"  (1.549 m), weight 100.1 kg (220 lb 11.2 oz), SpO2 97 %. PHYSICAL EXAMINATION:  Physical Exam  Constitutional: No distress.  Opened eyes on simuli  HENT:  Head: Normocephalic.  Eyes: Conjunctivae and EOM are normal. Pupils are equal, round, and reactive to light. No scleral icterus.  Neck: No JVD present.  Cardiovascular: Normal rate and regular rhythm.  Exam reveals no gallop.   No murmur heard. Pulmonary/Chest: Effort normal and breath sounds normal. No respiratory distress. She has no wheezes. She has no rales.  Abdominal: Soft. Bowel sounds are normal. She exhibits no distension.  Musculoskeletal: She exhibits no edema.  Neurological: She is alert.  Follow limited commands.  Skin: Skin is dry. No rash noted.  Psychiatric:  confused   LABORATORY PANEL:   CBC  Recent Labs Lab 08/12/16 0453  WBC 5.9  HGB 12.4  HCT 36.1  PLT 189   ------------------------------------------------------------------------------------------------------------------ Chemistries   Recent Labs Lab 08/11/16 1245 08/12/16 0453  NA 138 140  K 3.7 3.7  CL 107 111  CO2 21* 22  GLUCOSE 89 77  BUN 26* 27*  CREATININE 0.94 0.91  CALCIUM 9.5 9.1  AST 76*  --   ALT 37  --   ALKPHOS 65  --   BILITOT 0.4  --    RADIOLOGY:  No results found. ASSESSMENT AND  PLAN:   75 year old African-American female history of vascular dementia with recent stroke presenting with altered mental status  1. Acute metabolic encephalopathy Secondary to urinary tract infection and CVA. Continue ceftriaxone, urine culture: E Coli. Keep nothing by mouth with IV fluid support per speech palliative care to discuss with patient's family and possibly hospice home discharge.   * Diabetes with mild hypoglycemia. Since the patient is nothing by mouth, continue D5 normal saline with potassium. on sliding scale.  2. Essential hypertension urgency: not controled.   Since the patient cannot take home medications due to nothing by mouth status, on Lopressor IV and as needed IV hydralazine. Add nitroglycerin topically  3. Hyperlipidemia unspecified: Unable to get Statin therapy 4. Baseline vascular dementia, unable to take Aricept and Namenda 5. GERD without esophagitis: hold PPI. 6. Embolic stroke, palliative care discussed with patient's family and decided in regards to placement/discharge planning, continue aspirin rectally  Dehydration. Continue IV fluid support and follow-up BMP.    The patient has very poor prognosis, f/u palliative care consult. All the records are reviewed and case discussed with Care Management/Social Worker. Management plans discussed with the patient's son and he is in agreement.  CODE STATUS: DO NOT RESUSCITATE  TOTAL TIME TAKING CARE OF THIS PATIENT: 40minutes.   More than 50% of the time was spent in counseling/coordination of care: YES  POSSIBLE D/C IN 2-3 DAYS, DEPENDING ON CLINICAL CONDITION.   Katharina CaperVAICKUTE,Mike Berntsen M.D on 08/15/2016 at 11:48 AM  Between 7am to 6pm - Pager - [phone]  After 6pm go to [email or web address] - Social research officer, government  [facility] [place] [facility]  Office  [phone]  CC: Primary care physician; Dennison Mascot, MD  Note: This dictation was prepared with Dragon dictation along with smaller phrase  technology. Any transcriptional errors that result from this process are unintentional.

## 2016-08-15 NOTE — Progress Notes (Signed)
Physical Therapy Treatment Patient Details Name: Megan NewcomerBetty Lou Veazie MRN: [id number] DOB: [birth year] Today's Date: 08/15/2016    History of Present Illness Pt recently discharged from [facility] post CVA, unsure as to her level of function but she is here now with UTI/AMS and is very limited regarding communication.      PT Comments    Pt is not making good progress towards goals and is unable to participate in PT. Pt nonverbal and able to make eye contact, however unable to participate. Performed PROM this date, with multiple attempts for attention to task. Pt with no consistent muscle contraction during there-ex and very weak, requiring total assist for all movement. Will discharge Pt from caseload at this time. Please re-order if appropriate.  Follow Up Recommendations   (transfer to LTC-not able to participate in PT)     Equipment Recommendations       Recommendations for Other Services       Precautions / Restrictions Precautions Precautions: Fall Restrictions Weight Bearing Restrictions: No    Mobility  Bed Mobility               General bed mobility comments: not attempted as pt very lethargic and barely able to participate in therapy.  Transfers                    Ambulation/Gait                 Stairs            Wheelchair Mobility    Modified Rankin (Stroke Patients Only)       Balance                                    Cognition Arousal/Alertness: Lethargic Behavior During Therapy: Flat affect Overall Cognitive Status: Difficult to assess                      Exercises Other Exercises Other Exercises: PROM performed on B LE while supine including SLRs, hip abd/add, and heel slides. In addition, performed B elbow flexion/extension. All ther-ex performed x 5 reps with total assist. Heavy verbal/tactile cues for participation. Pt able to make eye contact, however unable to participate    General Comments         Pertinent Vitals/Pain Pain Assessment: No/denies pain    Home Living                      Prior Function            PT Goals (current goals can now be found in the care plan section) Acute Rehab PT Goals Patient Stated Goal: unable to state PT Goal Formulation: Patient unable to participate in goal setting Time For Goal Achievement: 08/26/16 Potential to Achieve Goals: Poor Progress towards PT goals: Goals downgraded-see care plan    Frequency  Min 2X/week    PT Plan Frequency needs to be updated    Co-evaluation             End of Session   Activity Tolerance: Patient limited by lethargy Patient left: in bed;with bed alarm set     Time: [id number] PT Time Calculation (min) (ACUTE ONLY): 9 min  Charges:  $Therapeutic Exercise: 8-22 mins                    G Codes:  Collen Hostler 08/15/2016, 11:24 AM  Elizabeth Palau, PT, DPT [phone]

## 2016-08-15 NOTE — Consult Note (Signed)
Consultation Note Date: 08/15/2016   Patient Name: Megan Gates  DOB: 1941-05-21  MRN: [id number]  Age / Sex: 75 y.o., female  PCP: Dennison MascotLemont Morrisey, MD Referring Physician: Katharina Caperima Vaickute, MD  Reason for Consultation: Establishing goals of care and Psychosocial/spiritual support  HPI/Patient Profile: 75 y.o. female  admitted on 08/11/2016 with known history of vascular dementia with recent stroke [facility] about a week ago presenting a nursing facility; with altered mental status.   Patient unable to provide any information given mental status. She was found to be near unresponsive/nonverbal thus brought to the Hospital further workup and evaluation.   Routine workup revealed urinary tract infection, Head CT:  08-12-16 IMPRESSION: 1. Multiple small acute supratentorial and infratentorial infarcts suggestive of emboli of a central source. 2. 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  Patient remains minimally responsive, family faced with advnaced directive decisions and anticipatory care needs.  Clinical Assessment and Goals of Care:    This NP Lorinda CreedMary Larach reviewed medical records, received report from team, assessed the patient and then meet at the patient's bedside along with her son/ Bernette RedbirdKenny Summers/HPOA  to discuss diagnosis, prognosis, GOC, EOL wishes disposition and options.  A detailed discussion was had today regarding advanced directives.  Concepts specific to code status, artifical feeding and hydration, continued IV antibiotics and rehospitalization was had.  The difference between a aggressive medical intervention path  and a palliative comfort care path for this patient at this time was had.  Values and goals of care important to patient and family were attempted to be elicited.  MOST form introduced.  Hard Choices  booklet left for review.  Concept of Hospice and Palliative Care were discussed  Natural trajectory and expectations at EOL were discussed.  Questions and concerns addressed.     Son understands the overall poor prognosis, his main hope is for comfort and dignity for his mother.  He needs time to process and make decisions for shift to full comfort. Plan to re-meet with this NP in the morning  Family encouraged to call with questions or concerns.  PMT will continue to support holistically.   SUMMARY OF RECOMMENDATIONS    Code Status/Advance Care Planning:  DNR   Symptom Management:   Dysphagia: Ice chips as tolerated   Palliative Prophylaxis:   Aspiration, Bowel Regimen, Frequent Pain Assessment and Oral Care  Additional Recommendations (Limitations, Scope, Preferences):  No Artificial Feeding  Psycho-social/Spiritual:   Desire for further Chaplaincy support:yes  Additional Recommendations: Education on Hospice  Prognosis:   < 4 weeks, dependant on desire for life prolonging measures  Discharge Planning:   Leaning toward a shift to full comfort, reevaluate in the morning.  Considering home with hospice for EOL care.   To Be Determined      Primary Diagnoses: Present on Admission: . Encephalopathy acute . Acute encephalopathy   I have reviewed the medical record, interviewed the patient and family, and examined the patient. The following aspects are  pertinent.  Past Medical History:  Diagnosis Date  . Depression   . Diabetes mellitus without complication (HCC)   . Hyperlipemia   . Hypertension   . Peripheral vascular disease (HCC)   . Seizures [facility])    Social History   Social History  . Marital status: Widowed    Spouse name: N/A  . Number of children: N/A  . Years of education: N/A   Social History Main Topics  . Smoking status: Former Smoker    Packs/day: 0.50    Years: 62.00    Types: Cigarettes  . Smokeless tobacco: Never Used  .  Alcohol use No  . Drug use: No  . Sexual activity: Not Asked   Other Topics Concern  . None   Social History Narrative  . None   Family History  Problem Relation Age of Onset  . Cancer Mother    Scheduled Meds: . aspirin  300 mg Rectal Daily  . atorvastatin  40 mg Oral Daily  . cefTRIAXone (ROCEPHIN)  IV  1 g Intravenous Q24H  . citalopram  20 mg Oral Daily  . clopidogrel  75 mg Oral Daily  . donepezil  10 mg Oral QHS  . enoxaparin (LOVENOX) injection  40 mg Subcutaneous Q24H  . fenofibrate  54 mg Oral Daily  . insulin aspart  0-5 Units Subcutaneous QHS  . insulin aspart  0-9 Units Subcutaneous TID WC  . levETIRAcetam  500 mg Intravenous BID  . lisinopril  5 mg Oral Daily  . mouth rinse  15 mL Mouth Rinse BID  . memantine  28 mg Oral Daily  . metoprolol  5 mg Intravenous Q6H  . modafinil  200 mg Oral Daily  . nitroGLYCERIN  0.5 inch Topical Q6H  . nystatin  5 mL Oral BID  . pantoprazole  40 mg Oral Daily   Continuous Infusions: . dextrose 5 % and 0.9 % NaCl with KCl 20 mEq/L Stopped (08/15/16 0945)   PRN Meds:.acetaminophen **OR** acetaminophen, hydrALAZINE, ondansetron **OR** ondansetron (ZOFRAN) IV, oxyCODONE Medications Prior to Admission:  Prior to Admission medications   Medication Sig Start Date End Date Taking? Authorizing Provider  Armodafinil (NUVIGIL) 150 MG tablet Take 150 mg by mouth daily.   Yes Historical Provider, MD  aspirin 81 MG EC tablet Take 81 mg by mouth daily.    Yes Historical Provider, MD  atorvastatin (LIPITOR) 40 MG tablet Take 40 mg by mouth daily.   Yes Historical Provider, MD  citalopram (CELEXA) 20 MG tablet TAKE 1 TABLET EVERY DAY 02/15/16  Yes Dennison Mascot, MD  clopidogrel (PLAVIX) 75 MG tablet Take 1 tablet (75 mg total) by mouth daily. 07/17/16  Yes Annice Needy, MD  donepezil (ARICEPT) 10 MG tablet TAKE 1 TABLET AT BEDTIME 02/15/16  Yes Dennison Mascot, MD  fenofibrate (TRICOR) 145 MG tablet TAKE 1 TABLET EVERY DAY 02/15/16  Yes Dennison Mascot, MD  ibuprofen (ADVIL,MOTRIN) 200 MG tablet Take 200 mg by mouth every 6 (six) hours as needed for mild pain.    Yes Historical Provider, MD  levETIRAcetam (KEPPRA) 500 MG tablet Take 500 mg by mouth 2 (two) times daily. 07/11/16  Yes Historical Provider, MD  lisinopril (PRINIVIL,ZESTRIL) 5 MG tablet Take 1 tablet (5 mg total) by mouth daily. 07/13/16  Yes Ellyn Hack, MD  meloxicam (MOBIC) 15 MG tablet TAKE 1 TABLET EVERY DAY 02/15/16  Yes Dennison Mascot, MD  NAMENDA XR 28 MG CP24 24 hr capsule TAKE 1 CAPSULE EVERY DAY  02/15/16  Yes Dennison Mascot, MD  omeprazole (PRILOSEC) 20 MG capsule TAKE 1 CAPSULE EVERY DAY 02/15/16  Yes Dennison Mascot, MD   No Known Allergies Review of Systems  Unable to perform ROS: Acuity of condition    Physical Exam  Constitutional: She appears well-developed. She appears lethargic. She appears ill.  HENT:  Mouth/Throat: Mucous membranes are dry.  Cardiovascular: Tachycardia present.   Pulmonary/Chest: She has decreased breath sounds in the right lower field and the left lower field.  Neurological: She appears lethargic. She exhibits abnormal muscle tone.  Skin: Skin is warm and dry.    Vital Signs: BP (!) 173/84 (BP Location: Left Arm)   Pulse (!) 101   Temp 97.8 F (36.6 C) (Oral)   Resp (!) 24   Ht 5\' 1"  (1.549 m)   Wt 100.1 kg (220 lb 11.2 oz)   SpO2 97%   BMI 41.70 kg/m  Pain Assessment: PAINAD POSS *See Group Information*: 1-Acceptable,Awake and alert Pain Score: 0-No pain   SpO2: SpO2: 97 % O2 Device:SpO2: 97 % O2 Flow Rate: .   IO: Intake/output summary:  Intake/Output Summary (Last 24 hours) at 08/15/16 1218 Last data filed at 08/15/16 3086  Gross per 24 hour  Intake                0 ml  Output                0 ml  Net                0 ml    LBM: Last BM Date: 08/15/16 Baseline Weight: Weight: 99.6 kg (219 lb 8 oz) Most recent weight: Weight: 100.1 kg (220 lb 11.2 oz)      Palliative Assessment/Data: 20  %   Flowsheet Rows   Flowsheet Row Most Recent Value  Intake Tab  Referral Department  Hospitalist  Unit at Time of Referral  Oncology Unit  Palliative Care Primary Diagnosis  Neurology  Date Notified  08/12/16  Palliative Care Type  New Palliative care  Reason for referral  Clarify Goals of Care  Date of Admission  08/11/16  # of days IP prior to Palliative referral  1  Clinical Assessment  Psychosocial & Spiritual Assessment  Palliative Care Outcomes     Discussed with Dr Winona Legato Time In: 1045 Time Out: 1200 Time Total: 75 min Greater than 50%  of this time was spent counseling and coordinating care related to the above assessment and plan.  Signed by: Lorinda Creed, NP   Please contact Palliative Medicine Team phone at [phone] for questions and concerns.  For individual provider: See Loretha Stapler

## 2016-08-15 NOTE — Progress Notes (Signed)
CSW spoke to patient's son. He reported that he wants his mother to go to LTC. Stated he's interested in [facility] and [facility]. CSW informed him that LTC beds are typically hard to come by. Informed him that she'd see if there are any available. CSW [facility] after she was informed they have a LTC bed available. CSW encouraged patient's son to apply for Medicaid to assist with paying for LTC. Per son he's already applied for LTC Medicaid. Awaiting phone call back form [facility]. CSW will continue to follow and assist.  Woodroe Modehristina Henryetta Corriveau, MSW, LCSW, LCAS-A Clinical Social Worker [phone]

## 2016-08-15 NOTE — Progress Notes (Signed)
Initial Nutrition Assessment  DOCUMENTATION CODES:   Morbid obesity  INTERVENTION:  -Monitor SLP recommendations regarding safe po intake.      NUTRITION DIAGNOSIS:   Inadequate oral intake related to acute illness as evidenced by NPO status.    GOAL:   Patient will meet greater than or equal to 90% of their needs    MONITOR:   Diet advancement, Labs  REASON FOR ASSESSMENT:   Low Braden    ASSESSMENT:      Pt admitted with UTI, CVA, AMS. Pt with vascular dementia with recent stroke ([facility] to [facility], SNF)  Pt with hx of DM, HTN, vascular dementia.  Son at bedside and reports pt's intake prior to admission was good, eating 3 meals per day and feeding self prior to admission.  Pt with oral thrush being treated currently.  SLP following and recommending NPO at this time  Son reports stable wt prior to admission as far as he knows.    Medications reviewed: aspart, nystatin, D5 NS with KCL at 3475ml/hr (provides 306 kcals/d)  Labs reviewed: BUN 27, creatinine WDL, FSBS 124, 84, 61, 124  Nutrition-Focused physical exam completed. Findings are WDL for fat depletion, muscle depletion, and edema.    Diet Order:  Diet NPO time specified  Skin:  Reviewed, no issues  Last BM:  9/5  Height:   Ht Readings from Last 1 Encounters:  08/12/16 5\' 1"  (1.549 m)    Weight:   Wt Readings from Last 1 Encounters:  08/12/16 220 lb 11.2 oz (100.1 kg)    Ideal Body Weight:     BMI:  Body mass index is 41.7 kg/m.  Estimated Nutritional Needs:   Kcal:  1400-1800 kcals/d  Protein:  70-90 g/d  Fluid:  >/= 141600ml/d  EDUCATION NEEDS:   No education needs identified at this time  Skylen Spiering B. Freida BusmanAllen, RD, LDN [phone] (pager) Weekend/On-Call pager [phone])

## 2016-08-16 ENCOUNTER — Inpatient Hospital Stay: Admit: 2016-08-16 | Payer: Medicare PPO

## 2016-08-16 DIAGNOSIS — I639 Cerebral infarction, unspecified: Secondary | ICD-10-CM

## 2016-08-16 DIAGNOSIS — R404 Transient alteration of awareness: Secondary | ICD-10-CM

## 2016-08-16 DIAGNOSIS — I10 Essential (primary) hypertension: Secondary | ICD-10-CM

## 2016-08-16 DIAGNOSIS — N390 Urinary tract infection, site not specified: Secondary | ICD-10-CM

## 2016-08-16 DIAGNOSIS — E11649 Type 2 diabetes mellitus with hypoglycemia without coma: Secondary | ICD-10-CM

## 2016-08-16 DIAGNOSIS — A0472 Enterocolitis due to Clostridium difficile, not specified as recurrent: Secondary | ICD-10-CM

## 2016-08-16 DIAGNOSIS — R627 Adult failure to thrive: Secondary | ICD-10-CM

## 2016-08-16 DIAGNOSIS — B9620 Unspecified Escherichia coli [E. coli] as the cause of diseases classified elsewhere: Secondary | ICD-10-CM

## 2016-08-16 LAB — GLUCOSE, CAPILLARY
Glucose-Capillary: 153 mg/dL — ABNORMAL HIGH (ref 65–99)
Glucose-Capillary: 143 mg/dL — ABNORMAL HIGH (ref 65–99)

## 2016-08-16 LAB — C DIFFICILE QUICK SCREEN W PCR REFLEX
C DIFFICILE (CDIFF) TOXIN: NEGATIVE
C DIFFICLE (CDIFF) ANTIGEN: POSITIVE — AB

## 2016-08-16 LAB — CLOSTRIDIUM DIFFICILE BY PCR: CDIFFPCR: POSITIVE — AB

## 2016-08-16 MED ORDER — METRONIDAZOLE IN NACL 5-0.79 MG/ML-% IV SOLN
500.0000 mg | Freq: Three times a day (TID) | INTRAVENOUS | Status: DC
  Administered 2016-08-16: 10:00:00 500 mg via INTRAVENOUS
  Filled 2016-08-16 (×3): qty 100

## 2016-08-16 MED ORDER — MORPHINE SULFATE (CONCENTRATE) 10 MG/0.5ML PO SOLN: 10.0000 mg | ORAL | 0 refills | Status: AC | PRN

## 2016-08-16 MED ORDER — ACETAMINOPHEN 650 MG RE SUPP: 650.0000 mg | Freq: Four times a day (QID) | RECTAL | 0 refills | Status: AC | PRN

## 2016-08-16 MED ORDER — LORAZEPAM 2 MG/ML IJ SOLN: 1.0000 mg | INTRAMUSCULAR | Status: DC | PRN

## 2016-08-16 MED ORDER — MORPHINE SULFATE (PF) 2 MG/ML IV SOLN: 1.0000 mg | INTRAVENOUS | Status: DC | PRN

## 2016-08-16 MED ORDER — LORAZEPAM 2 MG/ML PO CONC: 1.0000 mg | Freq: Four times a day (QID) | ORAL | 0 refills | Status: AC | PRN

## 2016-08-16 NOTE — Progress Notes (Signed)
Daily Progress Note   Patient Name: Megan Gates       Date: 08/16/2016 DOB: 04/08/1941  Age: 75 y.o. MRN#: [id number] Attending Physician: Katharina Caper, MD Primary Care Physician: Dennison Mascot, MD Admit Date: 08/11/2016  Reason for Consultation/Follow-up: Establishing goals of care, Hospice Evaluation, Non pain symptom management, Pain control and Psychosocial/spiritual support  Subjective:  -continued conversation with son and his wife/by telephone regarding diagnosis, prognosis, GOC and disposition  -family has made decision to shift to full comfort and hope to access a hospice facility  Length of Stay: 4  Current Medications: Scheduled Meds:  . aspirin  300 mg Rectal Daily  . atorvastatin  40 mg Oral Daily  . citalopram  20 mg Oral Daily  . clopidogrel  75 mg Oral Daily  . donepezil  10 mg Oral QHS  . enoxaparin (LOVENOX) injection  40 mg Subcutaneous Q24H  . fenofibrate  54 mg Oral Daily  . insulin aspart  0-5 Units Subcutaneous QHS  . insulin aspart  0-9 Units Subcutaneous TID WC  . levETIRAcetam  500 mg Intravenous BID  . lisinopril  5 mg Oral Daily  . mouth rinse  15 mL Mouth Rinse BID  . memantine  28 mg Oral Daily  . metoprolol  5 mg Intravenous Q6H  . metronidazole  500 mg Intravenous Q8H  . modafinil  200 mg Oral Daily  . nitroGLYCERIN  0.5 inch Topical Q6H  . nystatin  5 mL Oral BID  . pantoprazole  40 mg Oral Daily    Continuous Infusions: . dextrose 5 % and 0.9 % NaCl with KCl 20 mEq/L 100 mL/hr at 08/16/16 0559    PRN Meds: acetaminophen **OR** acetaminophen, hydrALAZINE, ondansetron **OR** ondansetron (ZOFRAN) IV, oxyCODONE  Physical Exam  Constitutional: She appears well-developed. She appears ill.  minimally responsive  HENT:  Mouth/Throat:  Mucous membranes are dry.  Cardiovascular: Tachycardia present.   Pulmonary/Chest: She has decreased breath sounds in the right lower field and the left lower field.  Skin: Skin is warm and dry.            Vital Signs: BP (!) 154/83 (BP Location: Left Arm)   Pulse (!) 119   Temp 97.8 F (36.6 C) (Oral)   Resp 20   Ht 5\' 1"  (1.549 m)   Wt 100.1  kg (220 lb 11.2 oz)   SpO2 94%   BMI 41.70 kg/m  SpO2: SpO2: 94 % O2 Device: O2 Device: Not Delivered O2 Flow Rate:    Intake/output summary: No intake or output data in the 24 hours ending 08/16/16 0908 LBM: Last BM Date: 08/15/16 Baseline Weight: Weight: 99.6 kg (219 lb 8 oz) Most recent weight: Weight: 100.1 kg (220 lb 11.2 oz)       Palliative Assessment/Data: 20 % at best    Flowsheet Rows   Flowsheet Row Most Recent Value  Intake Tab  Referral Department  Hospitalist  Unit at Time of Referral  Oncology Unit  Palliative Care Primary Diagnosis  Neurology  Date Notified  08/12/16  Palliative Care Type  New Palliative care  Reason for referral  Clarify Goals of Care  Date of Admission  08/11/16  # of days IP prior to Palliative referral  1  Clinical Assessment  Psychosocial & Spiritual Assessment  Palliative Care Outcomes      Patient Active Problem List   Diagnosis Date Noted  . Dysphagia 08/15/2016  . DNR (do not resuscitate) 08/15/2016  . Palliative care by specialist 08/15/2016  . Acute encephalopathy 08/12/2016  . Encephalopathy acute 08/11/2016  . UTI (lower urinary tract infection) 08/11/2016  . 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 Ischemic stroke of frontal lobe (HCC) 06/26/2016  . Heart murmur 05/30/2016  . 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 Hypertension 05/04/2016  . Hyperlipidemia 05/19/2015  . 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 Obesity 05/19/2015  . Dementia 05/19/2015  . Elevated BP 03/03/2015    Palliative Care Assessment & Plan    Assessment: 75 y.o.  female  admitted on 08/11/2016 with known history of vascular dementia with recent stroke [facility] presenting a nursing facility; with altered mental status.   Patient unable to provide any information given mental status. She was found to be near unresponsive/nonverbal thus brought to the Hospital further workup and evaluation.   Routine workup revealed urinary tract infection, Head CT:  08-12-16 IMPRESSION: 1. Multiple small acute supratentorial and infratentorial infarcts suggestive of emboli of a central source. 2. 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  Patient remains minimally responsive  Recommendations/Plan:  Comfort care and transition to hospice facility  Goals of Care and Additional Recommendations:  Limitations on Scope of Treatment: Full Scope Treatment  Code Status:    Code Status Orders        Start     Ordered   08/11/16 2006  Do not attempt resuscitation (DNR)  Continuous    Question Answer Comment  In the event of cardiac or respiratory ARREST Do not call a "code blue"   In the event of cardiac or respiratory ARREST Do not perform Intubation, CPR, defibrillation or ACLS   In the event of cardiac or respiratory ARREST Use medication by any route, position, wound care, and other measures to relive pain and suffering. May use oxygen, suction and manual treatment of airway obstruction as needed for comfort.      08/11/16 2005    Code Status History    Date Active Date Inactive Code Status Order ID Comments User Context   08/11/2016  8:05 PM 08/16/2016  7:43 AM DNR [id number]  Wyatt Hasteavid K Hower, MD Inpatient   08/11/2016  3:56 PM 08/11/2016  8:05 PM Full Code [id number]  Wyatt Hasteavid K Hower, MD ED   07/17/2016  9:37 AM 07/17/2016  2:48 PM Full Code [id number]  Annice Needy, MD Inpatient   12/22/2015  5:38 PM 12/22/2015 10:37 PM Full Code [id number]  Annice Needy, MD Inpatient    Questions for  Most Recent Historical Code Status (Order [id number])    Question Answer Comment   In the event of cardiac or respiratory ARREST Do not call a "code blue"    In the event of cardiac or respiratory ARREST Do not perform Intubation, CPR, defibrillation or ACLS    In the event of cardiac or respiratory ARREST Use medication by any route, position, wound care, and other measures to relive pain and suffering. May use oxygen, suction and manual treatment of airway obstruction as needed for comfort.         Advance Directive Documentation   Flowsheet Row Most Recent Value  Type of Advance Directive  Healthcare Power of Attorney  Pre-existing out of facility DNR order (yellow form or pink MOST form)  No data  "MOST" Form in Place?  No data       Prognosis:   < 2 weeks  Discharge Planning:  Hospice facility   Thank you for allowing the Palliative Medicine Team to assist in the care of this patient.   Time In: 0730 Time Out: 0805 Total Time 35 min Prolonged Time Billed  no      Greater than 50%  of this time was spent counseling and coordinating care related to the above assessment and plan.  Lorinda Creed, NP  Please contact Palliative Medicine Team phone at [phone] for questions and concerns.

## 2016-08-16 NOTE — Progress Notes (Signed)
CSW was informed by Palliative Care that patient's family is interested in Residential Hospice Placement. CSW attempted to contact patient's son. He was not in patient's room. CSW called patient's son and left a voicemail. Awaiting phone call back.  Woodroe Modehristina Alexandre Faries, MSW, LCSW, LCAS-A Clinical Social Worker [phone]

## 2016-08-16 NOTE — Plan of Care (Signed)
Problem: Education: Goal: Knowledge of [place] [facility] Education information/materials will improve Outcome: Not Progressing Pt not interactive during shift, no evidence of learning, increased knowledge.  Comments: Pt not interactive during shift.  No evidence of learning/increased knowledge.

## 2016-08-16 NOTE — Progress Notes (Signed)
Glasses, dentures, bible and book place in bag [facility] with patent and [facility]

## 2016-08-16 NOTE — Progress Notes (Signed)
CSW received phone call back from patient's son. Per patient's son he's interested in [facility]. CS[facility] a [facility]. CSW will continue to follow and assist.  Woodroe Modehristina Finnleigh Marchetti, MSW, LCSW, LCAS-A Clinical Social Worker [phone]

## 2016-08-16 NOTE — Progress Notes (Signed)
Son ended up taking dentures, glasses, bible and book of patient himself in his vehicle to hospice

## 2016-08-16 NOTE — Progress Notes (Signed)
New hospice home referral received from [place] following a Palliative medicine consult with NP Wadie Lessen. [name] is a 75 year old woman with a  Known history of recent CVA (8/31), vascular dementia, seizure disorder, DM II, PVD HTN and HLD, admitted from [facility] on 9/1 for evaluation of altered mental status and decreased responsiveness. Head CT on 9/2 revealed multiple small acute supratentorial and infratentorial infarcts and extensive small vessel disease with chronic lacunar infarcts. She has remained minimally responsive with in ability to take oral nutrition. She has been treated for a urinary tract infection and is currently being treated for C-diff. Patient's son Chrissie Noa met with Palliative Medicine NP Wadie Lessen and has chosen to focus on her comfort with transition to the hopsice home. Patient seen lying in bed, eyes open, no verbal response when greeted, son and staff report she is sleeping most of the time. She does not appear to be in any distress. Writer met in the family room with her son and HCPOA Laney Potash to initiate education regarding hospice services, philosophy of and team approach to care with good understanding voiced. Questions answered, consents signed. Patient information faxed to hospice referral, report called to the Hospice home, EMS notified for transport. Hospital care team all aware of plan for transport to the hospice home today. Signed portable DNR in place in discharge packet. Thank you for the opportunity to be involved in the care of this patient and her family. Flo Shanks RN, BSN, [place] [facility] [place], [facility] [phone]

## 2016-08-16 NOTE — Discharge Summary (Signed)
[facility] - [place] at [facility]   PATIENT NAME: Megan Gates    MR#:  [id number]  DATE OF BIRTH:  October 21, 1941  DATE OF ADMISSION:  08/11/2016 ADMITTING PHYSICIAN: Shaune Pollack, MD  DATE OF DISCHARGE: No discharge date for patient encounter.  PRIMARY CARE PHYSICIAN: Dennison Mascot, MD     ADMISSION DIAGNOSIS:  Altered mental state [R41.82] UTI (lower urinary tract infection) [N39.0] Altered mental status, unspecified altered mental status type [R41.82]  DISCHARGE DIAGNOSIS:  Principal Problem:   Cerebrovascular accident (CVA) due to embolism (HCC) Active Problems:   Encephalopathy acute   Acute encephalopathy   UTI (lower urinary tract infection)   Dysphagia   E. coli UTI   Essential hypertension, malignant   C. difficile diarrhea   Failure to thrive in adult   DNR (do not resuscitate)   Palliative care by specialist   Diabetes mellitus with hypoglycemia, without long-term current use of insulin (HCC)   SECONDARY DIAGNOSIS:   Past Medical History:  Diagnosis Date  . Depression   . Diabetes mellitus without complication (HCC)   . Hyperlipemia   . Hypertension   . Peripheral vascular disease (HCC)   . Seizures (HCC)     .pro HOSPITAL COURSE:   The patient is a 75 year old African-American female with past nuchal history significant for history of diabetes, depression, hypertension, hyperlipidemia, peripheral vascular disease, seizures, who presents to the hospital with complaints of altered mental status, poor responsiveness. On arrival to the hospital patient was poorly responsive to verbal stimuli. Not able to follow simple commands. Her labs were unremarkable, except of pyuria, concerning for urinary tract infection. Urine cultures revealed Escherichia coli, more than 100,000 colony forming units. Initial CT scan of the head revealed motion degraded scan, but no evidence of acute intracranial abnormality. Patient was initiated on antibiotic  therapy for urinary tract infection and supportive therapy, however, since her condition did not improve. MRI of the head was performed, revealing multiple small acute supratentorial and infratentorial infarcts suggestive of embolic phenomenon. Since patient's condition overall did not improve, she was not able to follow commands or eat, palliative care was consulted. After much consideration, patient's family decided on comfort care measures, patient will be discharged to hospice home today. Discussion by problem: #1. Acute metabolic encephalopathy Secondary to urinary tract infection and embolic CVA. No significant improvement with conservative therapy, patient remains awake, however, nonverbal and not able to follow commands. Discussion with family and palliative care yielded decision to discharge patient to hospice home today.  #2. Acute cystitis, the patient Completed ceftriaxone course, urine culture: E Coli.    #3 Diabetes with mild hypoglycemia. As the patient was nothing by mouth, she was continued on  D5 normal saline with potassium in the hospital.   #4. Essential hypertension urgency: not controled.   Since the patient could not take home medications due to nothing by mouth status, she was initiated on Lopressor IV and as needed IV hydralazine. Supportive therapy  #5. Hyperlipidemia unspecified: Unable to get Statin therapy due to nothing by mouth status  #6. Baseline vascular dementia, unable to take Aricept and Namenda  #7. GERD without esophagitis: hold PPI.  #8. C. difficile diarrhea, patient was initiated on Flagyl intravenously, but since she is being discharged to hospice home, no therapy is recommended, supportive therapy only  DISCHARGE CONDITIONS:   Poor  CONSULTS OBTAINED:  Treatment Team:  Wyatt Haste, MD Thana Farr, MD  DRUG ALLERGIES:  No Known Allergies  DISCHARGE  MEDICATIONS:   Current Discharge Medication List    START taking these  medications   Details  acetaminophen (TYLENOL) 650 MG suppository Place 1 suppository (650 mg total) rectally every 6 (six) hours as needed for mild pain (or Fever >/= 101). Qty: 12 suppository, Refills: 0    LORazepam (ATIVAN) 2 MG/ML concentrated solution Take 0.5 mLs (1 mg total) by mouth every 6 (six) hours as needed for anxiety. Qty: 30 mL, Refills: 0    Morphine Sulfate (MORPHINE CONCENTRATE) 10 MG/0.5ML SOLN concentrated solution Take 0.5 mLs (10 mg total) by mouth every 3 (three) hours as needed for moderate pain or severe pain. Qty: 30 mL, Refills: 0      CONTINUE these medications which have NOT CHANGED   Details  Armodafinil (NUVIGIL) 150 MG tablet Take 150 mg by mouth daily.    aspirin 81 MG EC tablet Take 81 mg by mouth daily.     atorvastatin (LIPITOR) 40 MG tablet Take 40 mg by mouth daily.    citalopram (CELEXA) 20 MG tablet TAKE 1 TABLET EVERY DAY Qty: 90 tablet, Refills: 1    clopidogrel (PLAVIX) 75 MG tablet Take 1 tablet (75 mg total) by mouth daily. Qty: 30 tablet, Refills: 11    donepezil (ARICEPT) 10 MG tablet TAKE 1 TABLET AT BEDTIME Qty: 90 tablet, Refills: 1    fenofibrate (TRICOR) 145 MG tablet TAKE 1 TABLET EVERY DAY Qty: 90 tablet, Refills: 1    ibuprofen (ADVIL,MOTRIN) 200 MG tablet Take 200 mg by mouth every 6 (six) hours as needed for mild pain.     levETIRAcetam (KEPPRA) 500 MG tablet Take 500 mg by mouth 2 (two) times daily.    lisinopril (PRINIVIL,ZESTRIL) 5 MG tablet Take 1 tablet (5 mg total) by mouth daily. Qty: 90 tablet, Refills: 3   Associated Diagnoses: Essential hypertension    meloxicam (MOBIC) 15 MG tablet TAKE 1 TABLET EVERY DAY Qty: 90 tablet, Refills: 1    NAMENDA XR 28 MG CP24 24 hr capsule TAKE 1 CAPSULE EVERY DAY Qty: 90 capsule, Refills: 1    omeprazole (PRILOSEC) 20 MG capsule TAKE 1 CAPSULE EVERY DAY Qty: 90 capsule, Refills: 1         DISCHARGE INSTRUCTIONS:    Patient is being discharged to hospice home,  no follow-up with primary care physician or specialist was recommended  If you experience worsening of your admission symptoms, develop shortness of breath, life threatening emergency, suicidal or homicidal thoughts you must seek medical attention immediately by calling 911 or calling your MD immediately  if symptoms less severe.  You Must read complete instructions/literature along with all the possible adverse reactions/side effects for all the Medicines you take and that have been prescribed to you. Take any new Medicines after you have completely understood and accept all the possible adverse reactions/side effects.   Please note  You were cared for by a hospitalist during your hospital stay. If you have any questions about your discharge medications or the care you received while you were in the hospital after you are discharged, you can call the unit and asked to speak with the hospitalist on call if the hospitalist that took care of you is not available. Once you are discharged, your primary care physician will handle any further medical issues. Please note that NO REFILLS for any discharge medications will be authorized once you are discharged, as it is imperative that you return to your primary care physician (or establish a relationship with a  primary care physician if you do not have one) for your aftercare needs so that they can reassess your need for medications and monitor your lab values.    Today   CHIEF COMPLAINT:   Chief Complaint  Patient presents with  . Altered Mental Status    HISTORY OF PRESENT ILLNESS:  Megan Gates  is a 75 y.o. female with a known history of diabetes, depression, hypertension, hyperlipidemia, peripheral vascular disease, seizures, who presents to the hospital with complaints of altered mental status, poor responsiveness. On arrival to the hospital patient was poorly responsive to verbal stimuli. Not able to follow simple commands. Her labs were  unremarkable, except of pyuria, concerning for urinary tract infection. Urine cultures revealed Escherichia coli, more than 100,000 colony forming units. Initial CT scan of the head revealed motion degraded scan, but no evidence of acute intracranial abnormality. Patient was initiated on antibiotic therapy for urinary tract infection and supportive therapy, however, since her condition did not improve. MRI of the head was performed, revealing multiple small acute supratentorial and infratentorial infarcts suggestive of embolic phenomenon. Since patient's condition overall did not improve, she was not able to follow commands or eat, palliative care was consulted. After much consideration, patient's family decided on comfort care measures, patient will be discharged to hospice home today. Discussion by problem: #1. Acute metabolic encephalopathy Secondary to urinary tract infection and embolic CVA. No significant improvement with conservative therapy, patient remains awake, however, nonverbal and not able to follow commands. Discussion with family and palliative care yielded decision to discharge patient to hospice home today.  #2. Acute cystitis, the patient Completed ceftriaxone course, urine culture: E Coli.    #3 Diabetes with mild hypoglycemia. As the patient was nothing by mouth, she was continued on  D5 normal saline with potassium in the hospital.   #4. Essential hypertension urgency: not controled.   Since the patient could not take home medications due to nothing by mouth status, she was initiated on Lopressor IV and as needed IV hydralazine. Supportive therapy  #5. Hyperlipidemia unspecified: Unable to get Statin therapy due to nothing by mouth status  #6. Baseline vascular dementia, unable to take Aricept and Namenda  #7. GERD without esophagitis: hold PPI.  #8. C. difficile diarrhea, patient was initiated on Flagyl intravenously, but since she is being discharged to hospice home, no  therapy is recommended, supportive therapy only    VITAL SIGNS:  Blood pressure (!) 154/83, pulse (!) 119, temperature 97.8 F (36.6 C), temperature source Oral, resp. rate 20, height 5\' 1"  (1.549 m), weight 100.1 kg (220 lb 11.2 oz), SpO2 94 %.  I/O:   Intake/Output Summary (Last 24 hours) at 08/16/16 1052 Last data filed at 08/16/16 0900  Gross per 24 hour  Intake                5 ml  Output              400 ml  Net             -395 ml    PHYSICAL EXAMINATION:  GENERAL:  75 y.o.-year-old patient lying in the bed with no acute distress.  EYES: Pupils equal, round, reactive to light and accommodation. No scleral icterus. Extraocular muscles intact.  HEENT: Head atraumatic, normocephalic. Oropharynx and nasopharynx clear.  NECK:  Supple, no jugular venous distention. No thyroid enlargement, no tenderness.  LUNGS: Normal breath sounds bilaterally, no wheezing, rales,rhonchi or crepitation. No use of accessory muscles of respiration.  CARDIOVASCULAR: S1, S2 normal. No murmurs, rubs, or gallops.  ABDOMEN: Soft, non-tender, non-distended. Bowel sounds present. No organomegaly or mass.  EXTREMITIES: No pedal edema, cyanosis, or clubbing.  NEUROLOGIC: Cranial nerves II through XII Reveal facial droop. Muscle strength in upper extremities, unable to evaluate, not following commands, able to withdraw on the left, not moving on the right, lower extremities, able to withdraw bilaterally.  Sensation grossly intact. Gait not checked.  PSYCHIATRIC: The patient is alert , not oriented, nonverbal, not following commands.  SKIN: No obvious rash, lesion, or ulcer. Necrotic right finger distal phalanx  DATA REVIEW:   CBC  Recent Labs Lab 08/12/16 0453  WBC 5.9  HGB 12.4  HCT 36.1  PLT 189    Chemistries   Recent Labs Lab 08/11/16 1245 08/12/16 0453  NA 138 140  K 3.7 3.7  CL 107 111  CO2 21* 22  GLUCOSE 89 77  BUN 26* 27*  CREATININE 0.94 0.91  CALCIUM 9.5 9.1  AST 76*  --    ALT 37  --   ALKPHOS 65  --   BILITOT 0.4  --     Cardiac Enzymes  Recent Labs Lab 08/11/16 1245  TROPONINI <0.03    Microbiology Results  Results for orders placed or performed during the hospital encounter of 08/11/16  Urine culture     Status: Abnormal   Collection Time: 08/11/16  3:17 PM  Result Value Ref Range Status   Specimen Description URINE, RANDOM  Final   Special Requests NONE  Final   Culture >=100,000 COLONIES/mL ESCHERICHIA COLI (A)  Final   Report Status 08/14/2016 FINAL  Final   Organism ID, Bacteria ESCHERICHIA COLI (A)  Final      Susceptibility   Escherichia coli - MIC*    AMPICILLIN >=32 RESISTANT Resistant     CEFAZOLIN 16 SENSITIVE Sensitive     CEFTRIAXONE <=1 SENSITIVE Sensitive     CIPROFLOXACIN <=0.25 SENSITIVE Sensitive     GENTAMICIN <=1 SENSITIVE Sensitive     IMIPENEM <=0.25 SENSITIVE Sensitive     NITROFURANTOIN <=16 SENSITIVE Sensitive     TRIMETH/SULFA <=20 SENSITIVE Sensitive     AMPICILLIN/SULBACTAM >=32 RESISTANT Resistant     PIP/TAZO <=4 SENSITIVE Sensitive     Extended ESBL NEGATIVE Sensitive     * >=100,000 COLONIES/mL ESCHERICHIA COLI  MRSA PCR Screening     Status: None   Collection Time: 08/11/16  6:24 PM  Result Value Ref Range Status   MRSA by PCR NEGATIVE NEGATIVE Final    Comment:        The GeneXpert MRSA Assay (FDA approved for NASAL specimens only), is one component of a comprehensive MRSA colonization surveillance program. It is not intended to diagnose MRSA infection nor to guide or monitor treatment for MRSA infections.   C difficile quick scan w PCR reflex     Status: Abnormal   Collection Time: 08/15/16  9:40 PM  Result Value Ref Range Status   C Diff antigen POSITIVE (A) NEGATIVE Final   C Diff toxin NEGATIVE NEGATIVE Final   C Diff interpretation Results are indeterminate. See PCR results.  Final  Clostridium Difficile by PCR     Status: Abnormal   Collection Time: 08/15/16  9:40 PM  Result Value  Ref Range Status   Toxigenic C Difficile by pcr POSITIVE (A) NEGATIVE Final    Comment: CRITICAL RESULT CALLED TO, READ BACK BY AND VERIFIED WITH: CAROLINE SONBAY ON 08/16/16 AT 0051 BY TLB Positive for  toxigenic C. difficile with little to no toxin production. Only treat if clinical presentation suggests symptomatic illness.     RADIOLOGY:  Dg Chest Port 1 View  Result Date: 08/15/2016 CLINICAL DATA:  75 year old female post PICC line placement. Subsequent encounter. EXAM: PORTABLE CHEST 1 VIEW COMPARISON:  08/11/2016 chest x-ray FINDINGS: Patient is rotated to the right. This distorts the mediastinal and cardiac silhouette. The right-sided PICC line tip appears to be at the level of the mid to distal superior vena cava. This could be assessed on follow-up exam without rotation if clinically desired. Central pulmonary vascular prominence. Tortuous aorta. Cardiomegaly. IMPRESSION: Patient is rotated to the right. This distorts the mediastinal and cardiac silhouette. The right-sided PICC line tip appears to be at the level of the mid to distal superior vena cava. This could be assessed on follow-up exam without rotation if clinically desired. Electronically Signed   By: Lacy Duverney M.D.   On: 08/15/2016 17:26    EKG:   Orders placed or performed during the hospital encounter of 08/11/16  . ED EKG within 10 minutes  . ED EKG within 10 minutes  . EKG 12-Lead  . EKG 12-Lead      Management plans discussed with the patient, family and they are in agreement.  CODE STATUS:     Code Status Orders        Start     Ordered   08/11/16 2006  Do not attempt resuscitation (DNR)  Continuous    Question Answer Comment  In the event of cardiac or respiratory ARREST Do not call a "code blue"   In the event of cardiac or respiratory ARREST Do not perform Intubation, CPR, defibrillation or ACLS   In the event of cardiac or respiratory ARREST Use medication by any route, position, wound care, and  other measures to relive pain and suffering. May use oxygen, suction and manual treatment of airway obstruction as needed for comfort.      08/11/16 2005    Code Status History    Date Active Date Inactive Code Status Order ID Comments User Context   08/11/2016  8:05 PM 08/16/2016  7:43 AM DNR [id number]  Wyatt Haste, MD Inpatient   08/11/2016  3:56 PM 08/11/2016  8:05 PM Full Code [id number]  Wyatt Haste, MD ED   07/17/2016  9:37 AM 07/17/2016  2:48 PM Full Code [id number]  Annice Needy, MD Inpatient   12/22/2015  5:38 PM 12/22/2015 10:37 PM Full Code [id number]  Annice Needy, MD Inpatient    Questions for Most Recent Historical Code Status (Order [id number])    Question Answer Comment   In the event of cardiac or respiratory ARREST Do not call a "code blue"    In the event of cardiac or respiratory ARREST Do not perform Intubation, CPR, defibrillation or ACLS    In the event of cardiac or respiratory ARREST Use medication by any route, position, wound care, and other measures to relive pain and suffering. May use oxygen, suction and manual treatment of airway obstruction as needed for comfort.         Advance Directive Documentation   Flowsheet Row Most Recent Value  Type of Advance Directive  Healthcare Power of Attorney  Pre-existing out of facility DNR order (yellow form or pink MOST form)  No data  "MOST" Form in Place?  No data      TOTAL TIME TAKING CARE OF THIS PATIENT: 40 minutes.    Jaynie [email or web address]  on 08/16/2016 at 10:52 AM  Between 7am to 6pm - Pager - [phone]  After 6pm go to [email or web address] - password [email or web address]  [facility]  Office  [phone]  CC: Primary care physician; Dennison MascotLemont Morrisey, MD

## 2016-08-16 NOTE — Progress Notes (Signed)
CSW was informed by [facility] that there is a bed available for patient today 08/16/16. CSW created discharge packet and informed MD. Patient will discharge today to [facility] via EMS.  Woodroe Modehristina Shadow Stiggers, MSW, LCSW, LCAS-A Clinical Social Worker [phone]

## 2016-08-21 DIAGNOSIS — R4182 Altered mental status, unspecified: Secondary | ICD-10-CM

## 2016-09-10 DEATH — deceased

## 2016-11-30 ENCOUNTER — Ambulatory Visit: Payer: Medicare PPO | Admitting: Family Medicine

## 2017-09-10 IMAGING — CR DG CHEST 2V
1 series · 2 of 2 positions shown · non-contrast
Comparison: 08/21/2006.

CLINICAL DATA: Recent stroke.  Nonverbal.

EXAM:
CHEST  2 VIEW

[Series 1: x chest ap · 0.14mm/px · 2 of 2 slices shown]
[im 1/2]
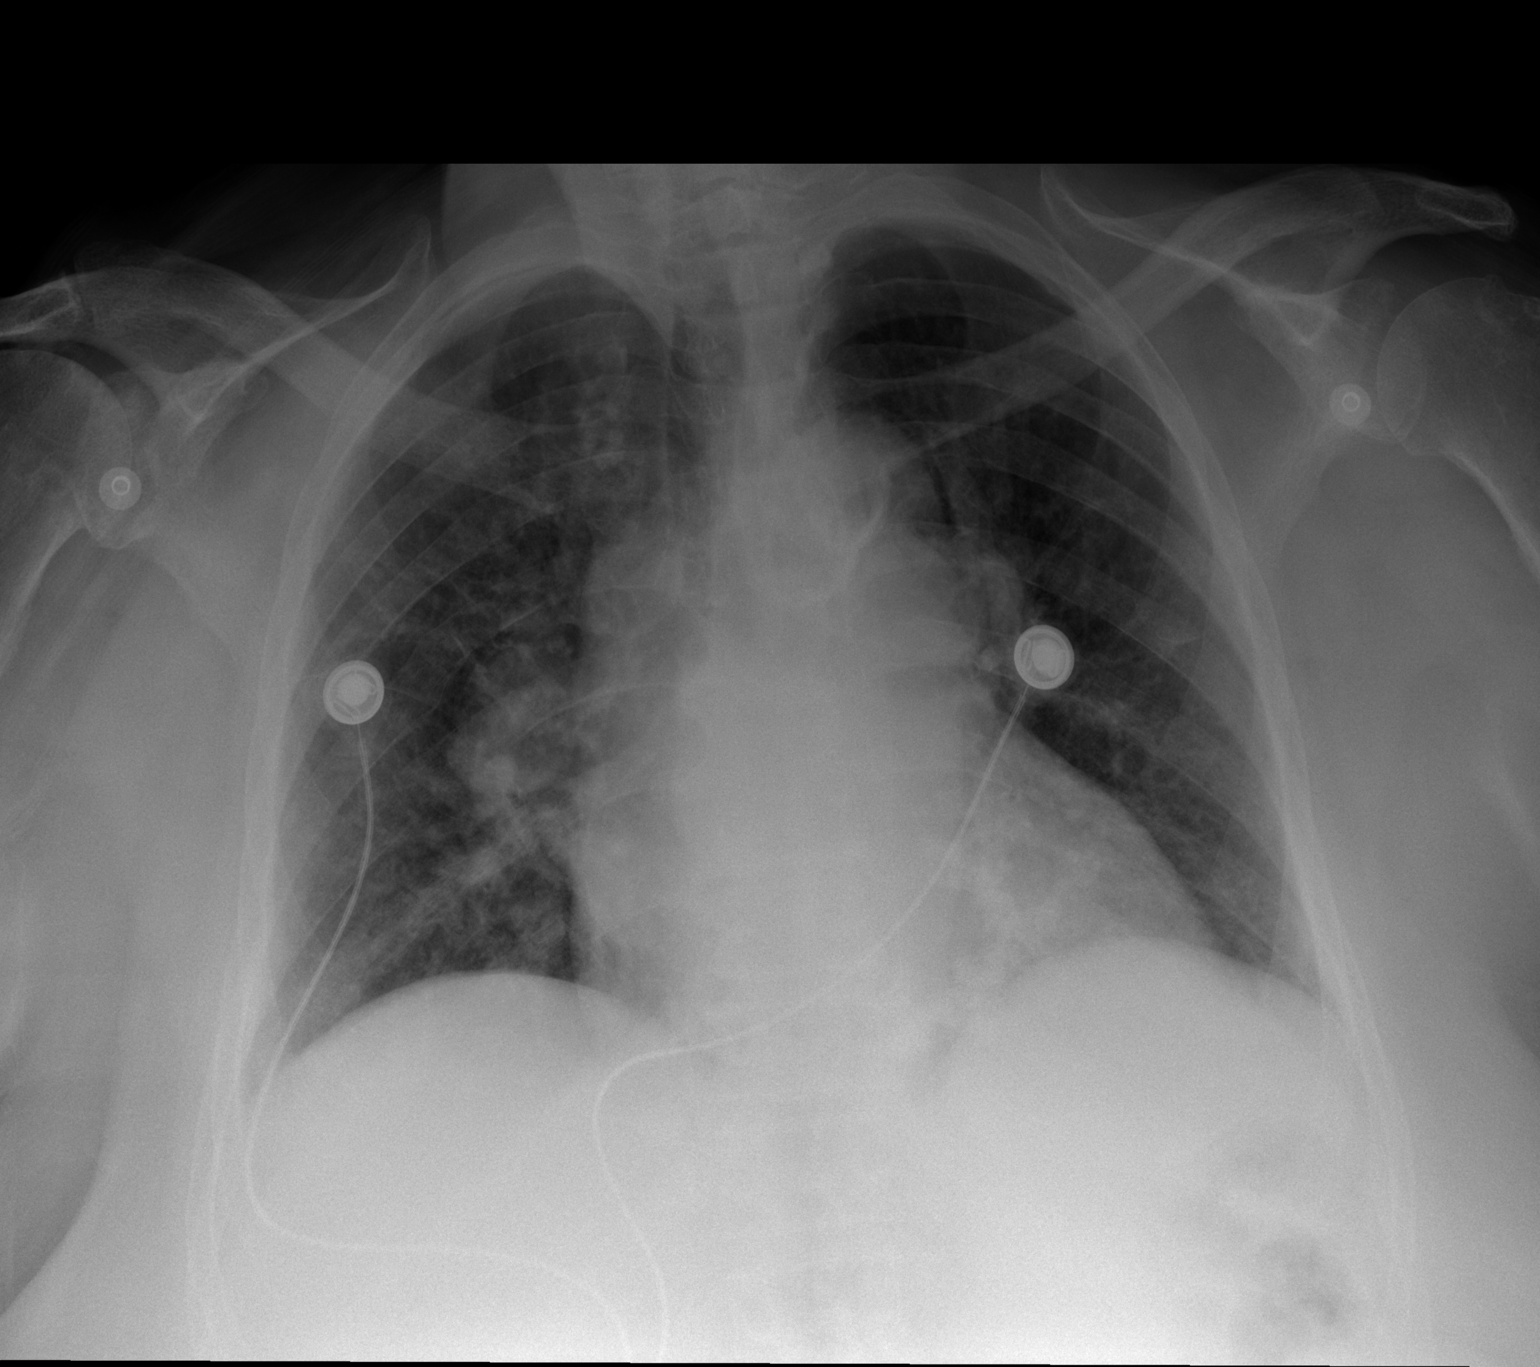
[im 2/2]
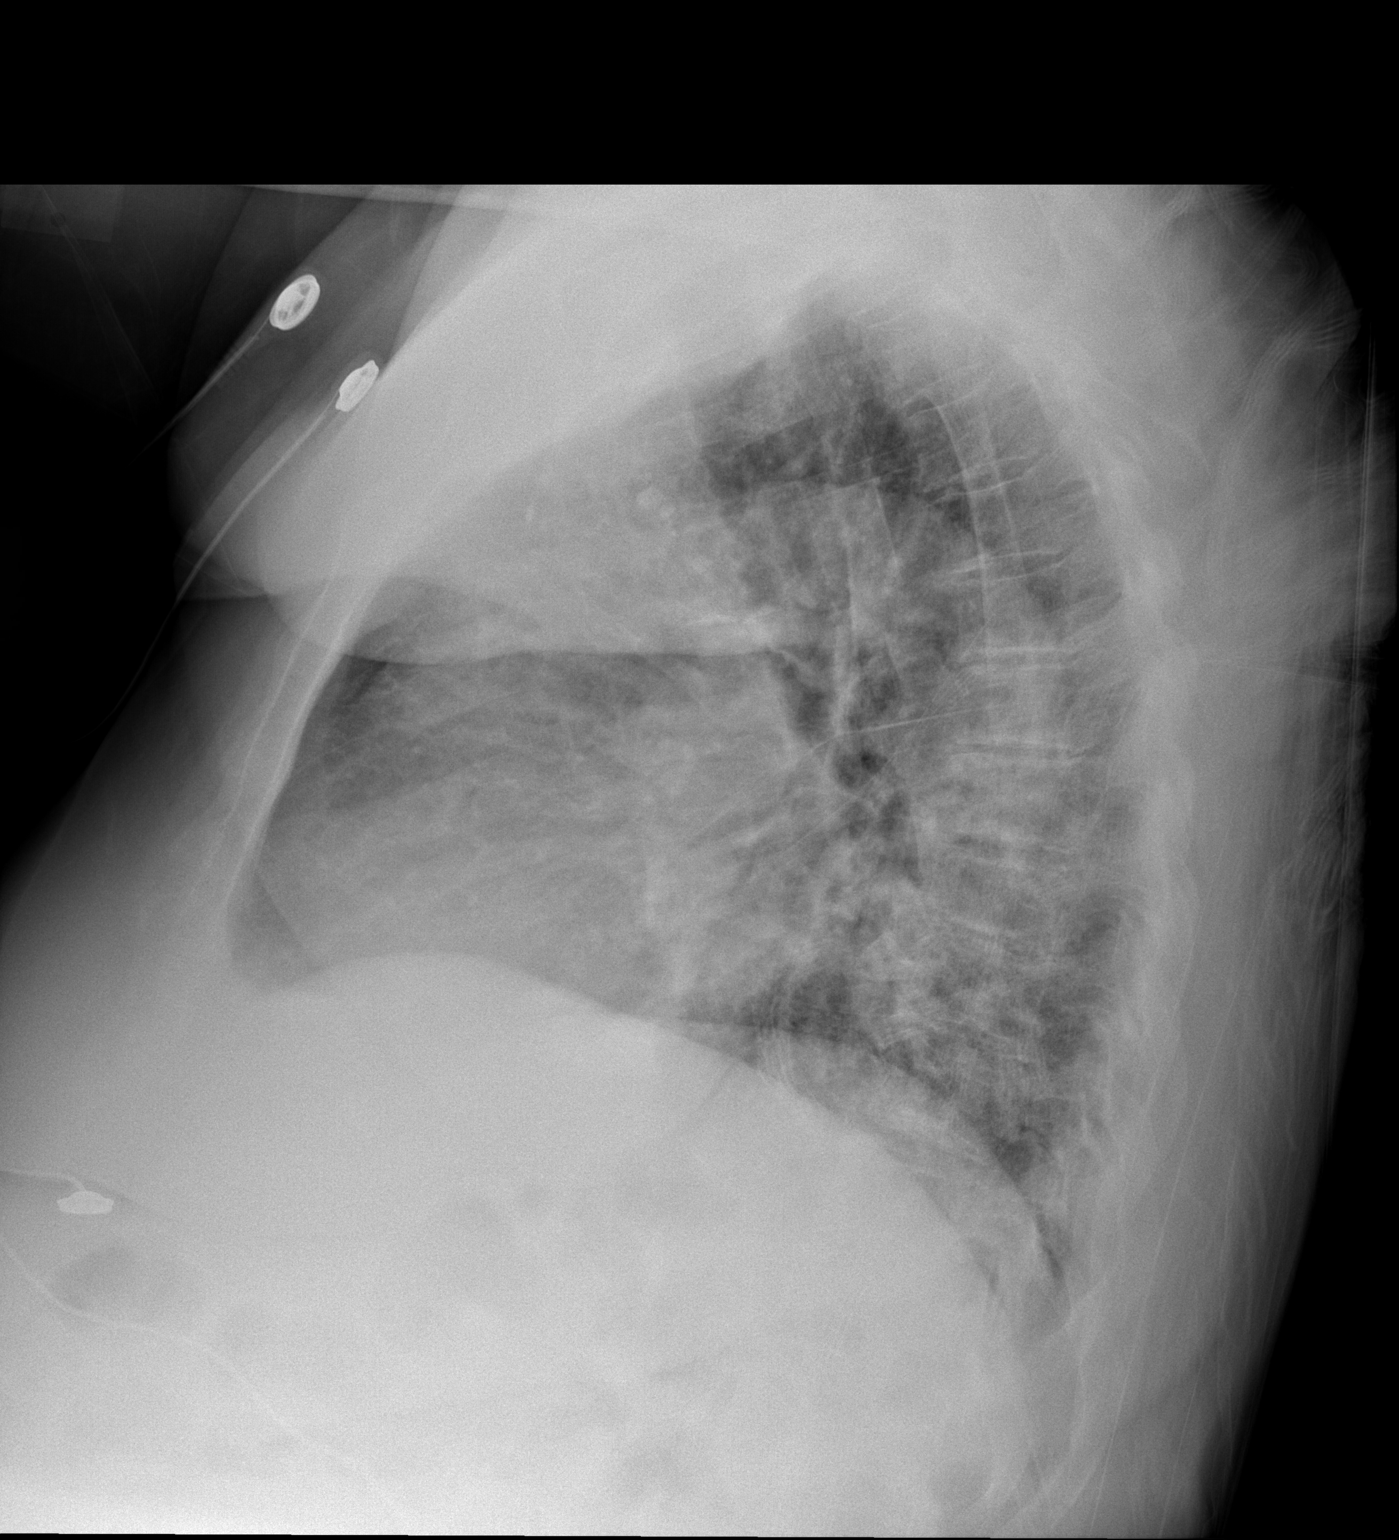

[2 of 2 positions shown; findings below may reference images not displayed]

FINDINGS: Enlarged cardiomediastinal silhouette. Edema throughout both lung
fields suggesting early CHF. No pneumothorax or consolidation. Bones
unremarkable. Thoracic atherosclerosis.
IMPRESSION: Cardiomegaly. Early CHF not excluded. Worsening aeration from
priors.

## 2017-09-10 IMAGING — CT CT HEAD W/O CM
4 of 8 series · 16 of 47 positions shown, 18 images · non-contrast
Comparison: 06/24/2016 MRI brain and 08/21/2006 head CT.

CLINICAL DATA: Altered mental state.  History of seizures.

EXAM:
CT HEAD WITHOUT CONTRAST
TECHNIQUE: Contiguous axial images were obtained from the base of the skull
through the vertex without intravenous contrast.

[Series 2: head wo · axial · 0.40mm/px · z∈[-56,+49]mm · 7 of 29 slices shown, 9 images (1 of 2)]
[im 4/29  brain]
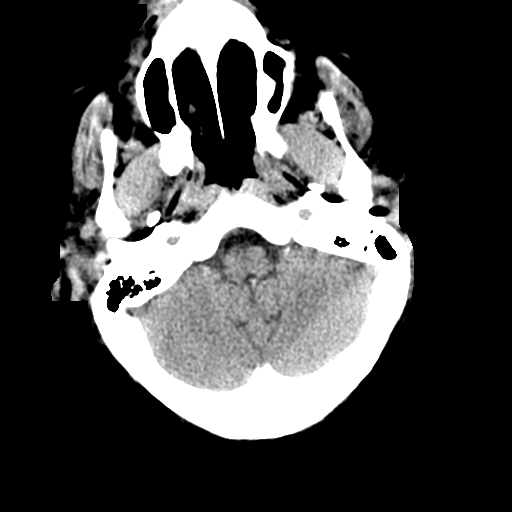
[im 4/29  bone]
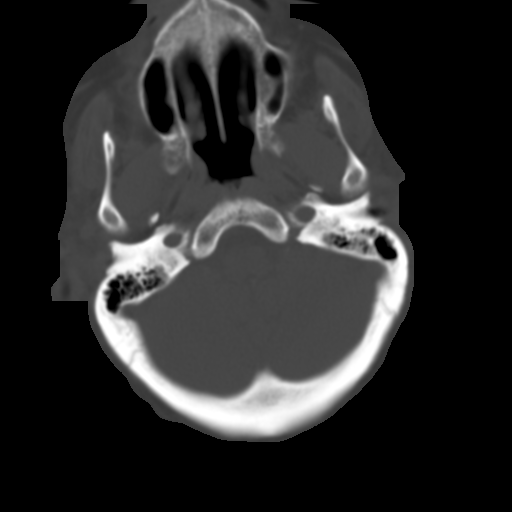
[im 8/29  brain]
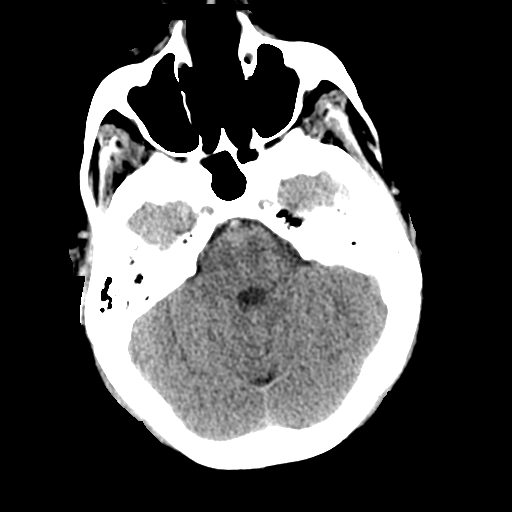
[im 11/29  brain]
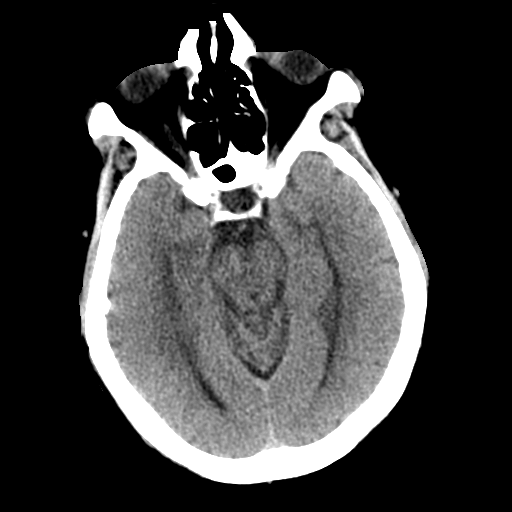
[im 15/29  brain]
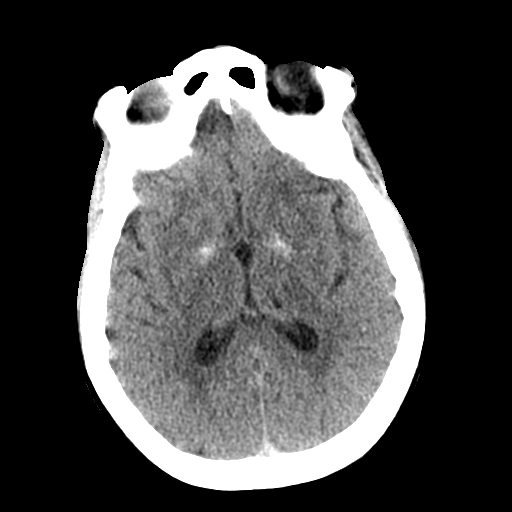
[im 18/29  brain]
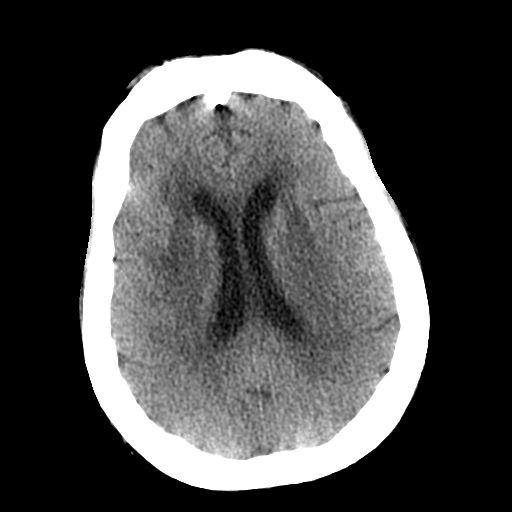
[im 18/29  bone]
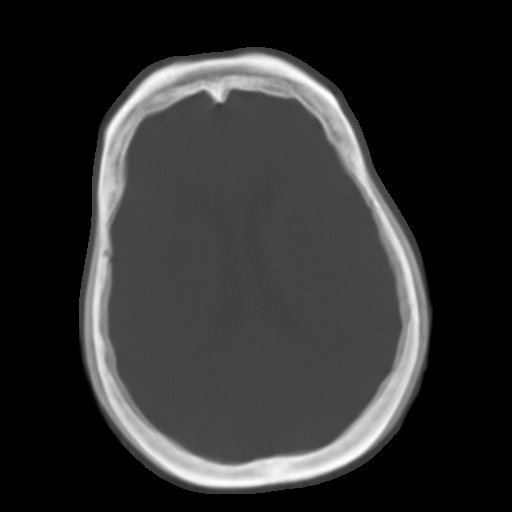
[im 22/29  brain]
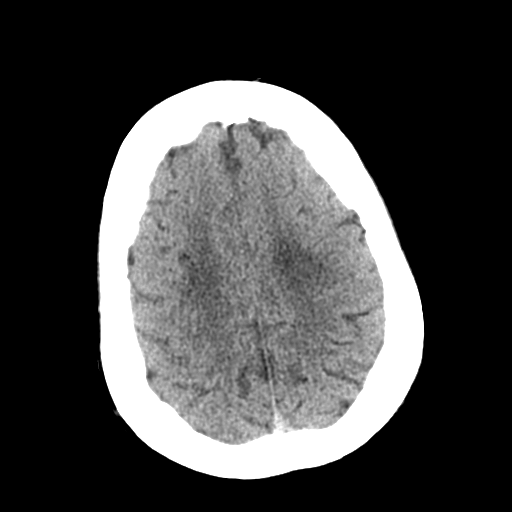
[im 25/29  brain]
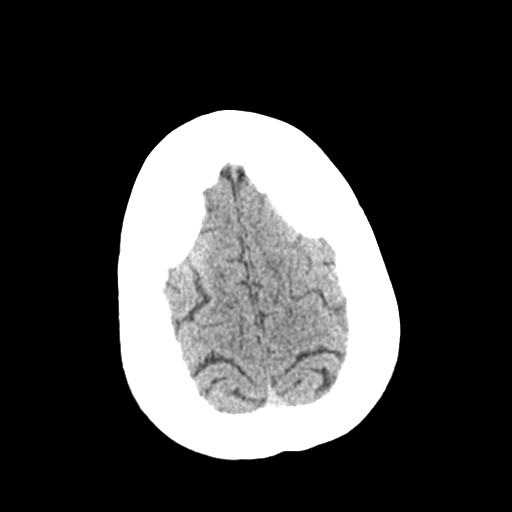

[Series 6: head wo · axial · 0.40mm/px · z∈[-51,+9]mm · 4 of 29 slices shown (2 of 2)]
[im 5/29  brain]
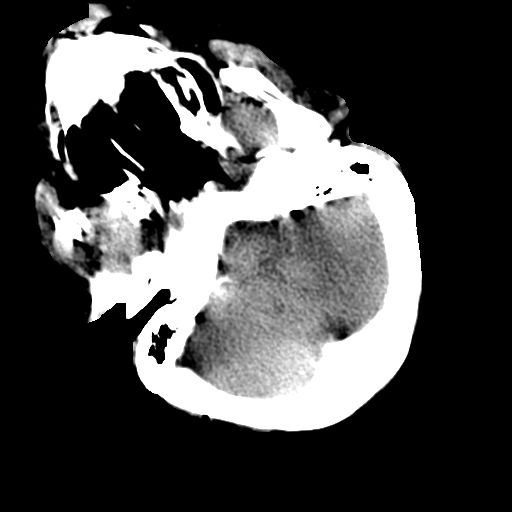
[im 9/29  brain]
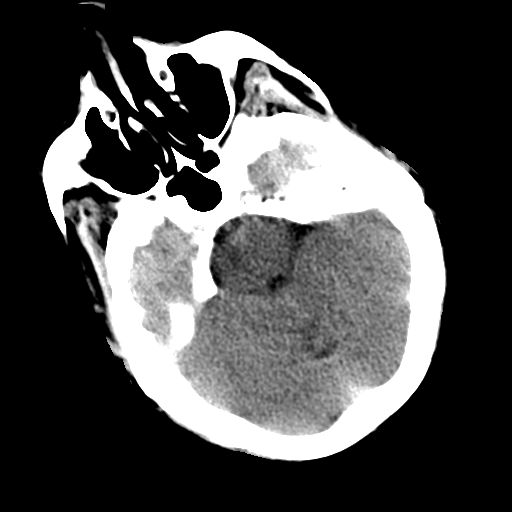
[im 13/29  brain]
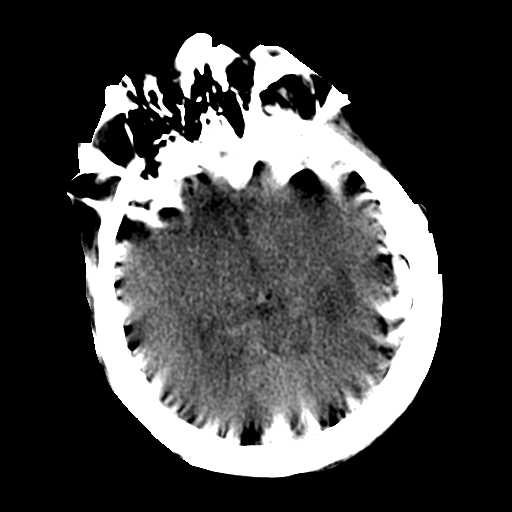
[im 17/29  brain]
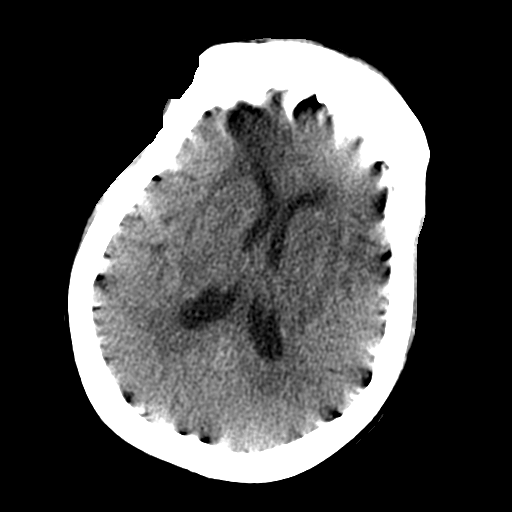

[Series 7: coronal soft tissue · coronal · 0.28mm/px · 3 of 84 slices shown]
[im 20/84  brain]
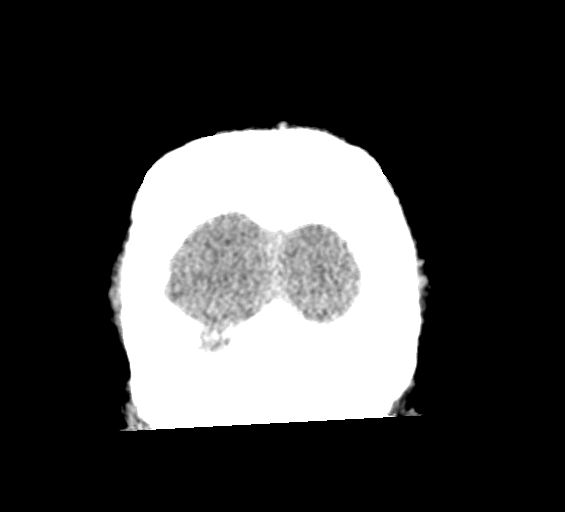
[im 35/84  brain]
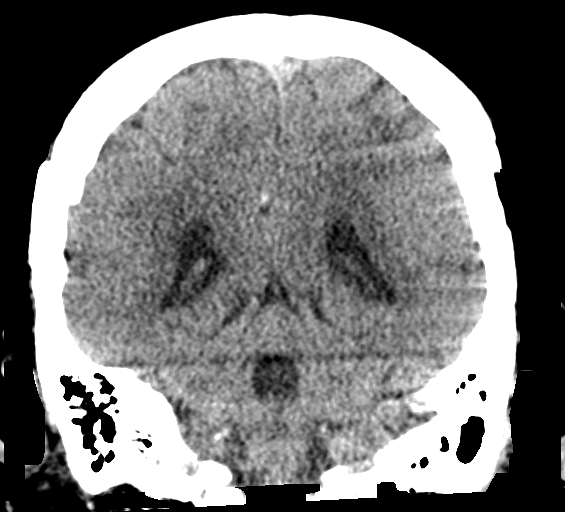
[im 50/84  brain]
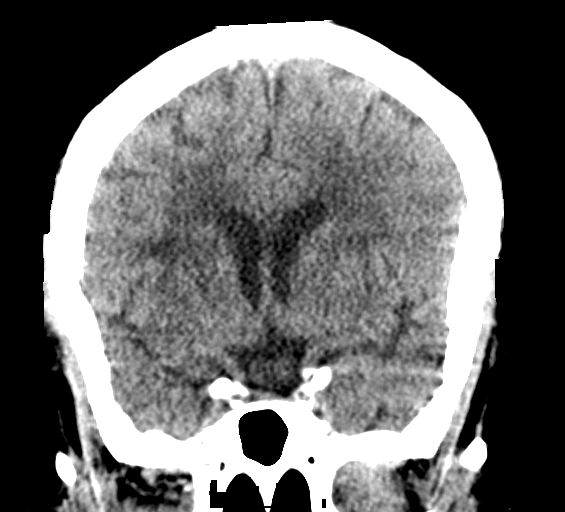

[Series 8: sagittal soft tissue · sagittal · 0.28mm/px · 2 of 52 slices shown]
[im 18/52  brain]
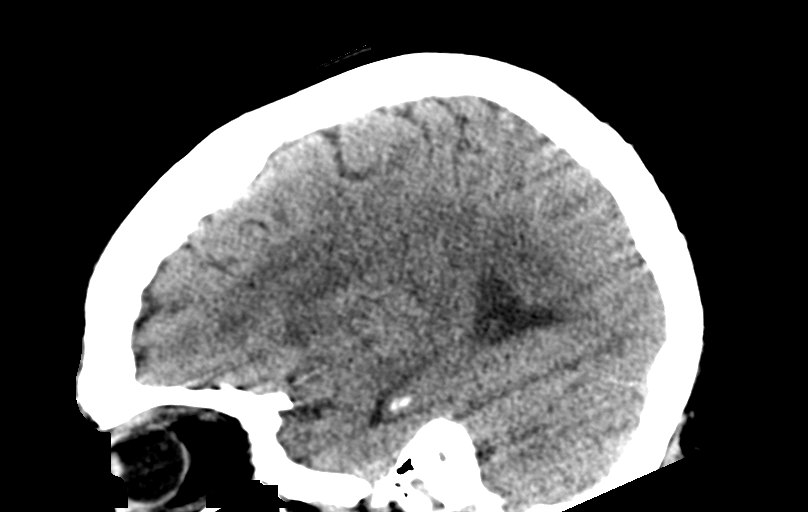
[im 35/52  brain]
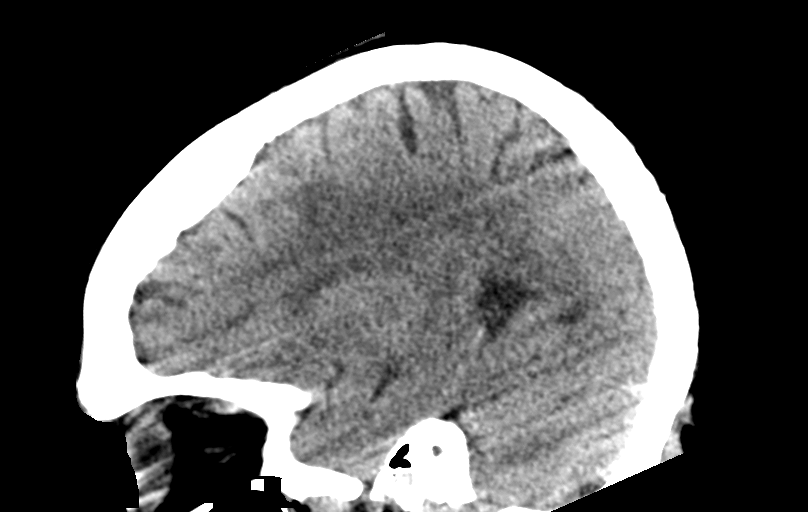

[16 of 47 positions shown; findings below may reference images not displayed]

FINDINGS: Brain: Motion degraded scan. No evidence of parenchymal hemorrhage
or extra-axial fluid collection. No mass lesion, mass effect, or
midline shift. No CT evidence of acute infarction. Small left
thalamic lacune. Intracranial atherosclerosis. Nonspecific moderate
subcortical and periventricular white matter hypodensity, most in
keeping with chronic small vessel ischemic change. Cerebral volume
is age appropriate. No ventriculomegaly.

Vascular: No hyperdense vessel or unexpected calcification.

Skull: No evidence of calvarial fracture.

Sinuses/Orbits: The visualized paranasal sinuses are essentially
clear. Left lens surgery.

Other:  The mastoid air cells are unopacified.
IMPRESSION: 1. Motion degraded scan. No evidence of acute intracranial
abnormality.
2. Moderate chronic small vessel ischemia and small left thalamic
lacune.
